# Patient Record
Sex: Male | Born: 1965 | Race: White | Hispanic: No | Smoking: Never smoker
Health system: Southern US, Community
[De-identification: ages and names within clinical notes are randomized; demographics above are authoritative.]

## PROBLEM LIST (undated history)

## (undated) DIAGNOSIS — F329 Major depressive disorder, single episode, unspecified: Secondary | ICD-10-CM

## (undated) DIAGNOSIS — K219 Gastro-esophageal reflux disease without esophagitis: Secondary | ICD-10-CM

## (undated) DIAGNOSIS — R112 Nausea with vomiting, unspecified: Secondary | ICD-10-CM

## (undated) DIAGNOSIS — F32A Depression, unspecified: Secondary | ICD-10-CM

## (undated) DIAGNOSIS — Z9889 Other specified postprocedural states: Secondary | ICD-10-CM

## (undated) DIAGNOSIS — F952 Tourette's disorder: Secondary | ICD-10-CM

## (undated) DIAGNOSIS — Z87442 Personal history of urinary calculi: Secondary | ICD-10-CM

## (undated) DIAGNOSIS — G4730 Sleep apnea, unspecified: Secondary | ICD-10-CM

## (undated) HISTORY — PX: OTHER SURGICAL HISTORY: SHX169

## (undated) HISTORY — PX: UVULOPALATOPHARYNGOPLASTY: SHX827

## (undated) HISTORY — PX: TONSILLECTOMY: SUR1361

## (undated) HISTORY — PX: FUNCTIONAL ENDOSCOPIC SINUS SURGERY: SUR616

## (undated) HISTORY — PX: BACK SURGERY: SHX140

## (undated) HISTORY — PX: SHOULDER SURGERY: SHX246

## (undated) HISTORY — PX: CHOLECYSTECTOMY: SHX55

## (undated) HISTORY — PX: ELBOW SURGERY: SHX618

---

## 1999-04-15 ENCOUNTER — Emergency Department (HOSPITAL_COMMUNITY): Admission: EM | Admit: 1999-04-15 | Discharge: 1999-04-15 | Payer: Self-pay

## 2000-09-01 ENCOUNTER — Ambulatory Visit (HOSPITAL_BASED_OUTPATIENT_CLINIC_OR_DEPARTMENT_OTHER): Admission: RE | Admit: 2000-09-01 | Discharge: 2000-09-01 | Payer: Self-pay | Admitting: Orthopedic Surgery

## 2001-12-10 ENCOUNTER — Encounter: Payer: Self-pay | Admitting: Emergency Medicine

## 2001-12-10 ENCOUNTER — Emergency Department (HOSPITAL_COMMUNITY): Admission: EM | Admit: 2001-12-10 | Discharge: 2001-12-10 | Payer: Self-pay | Admitting: Emergency Medicine

## 2002-08-30 ENCOUNTER — Ambulatory Visit (HOSPITAL_BASED_OUTPATIENT_CLINIC_OR_DEPARTMENT_OTHER): Admission: RE | Admit: 2002-08-30 | Discharge: 2002-08-30 | Payer: Self-pay | Admitting: Orthopedic Surgery

## 2003-06-11 ENCOUNTER — Emergency Department (HOSPITAL_COMMUNITY): Admission: EM | Admit: 2003-06-11 | Discharge: 2003-06-12 | Payer: Self-pay | Admitting: *Deleted

## 2004-03-04 ENCOUNTER — Ambulatory Visit: Admission: RE | Admit: 2004-03-04 | Discharge: 2004-03-04 | Payer: Self-pay | Admitting: Pulmonary Disease

## 2004-04-02 ENCOUNTER — Ambulatory Visit (HOSPITAL_BASED_OUTPATIENT_CLINIC_OR_DEPARTMENT_OTHER): Admission: RE | Admit: 2004-04-02 | Discharge: 2004-04-02 | Payer: Self-pay | Admitting: Pulmonary Disease

## 2004-05-08 ENCOUNTER — Ambulatory Visit: Payer: Self-pay | Admitting: Pulmonary Disease

## 2004-06-09 ENCOUNTER — Ambulatory Visit: Payer: Self-pay | Admitting: Pulmonary Disease

## 2004-08-28 ENCOUNTER — Ambulatory Visit: Payer: Self-pay | Admitting: Pulmonary Disease

## 2004-09-17 ENCOUNTER — Ambulatory Visit (HOSPITAL_COMMUNITY): Admission: RE | Admit: 2004-09-17 | Discharge: 2004-09-18 | Payer: Self-pay | Admitting: Orthopedic Surgery

## 2004-11-13 ENCOUNTER — Encounter (INDEPENDENT_AMBULATORY_CARE_PROVIDER_SITE_OTHER): Payer: Self-pay | Admitting: Specialist

## 2004-11-13 ENCOUNTER — Ambulatory Visit (HOSPITAL_COMMUNITY): Admission: RE | Admit: 2004-11-13 | Discharge: 2004-11-14 | Payer: Self-pay | Admitting: Otolaryngology

## 2004-11-20 ENCOUNTER — Observation Stay (HOSPITAL_COMMUNITY): Admission: AD | Admit: 2004-11-20 | Discharge: 2004-11-21 | Payer: Self-pay | Admitting: Otolaryngology

## 2005-09-20 ENCOUNTER — Encounter: Admission: RE | Admit: 2005-09-20 | Discharge: 2005-09-20 | Payer: Self-pay | Admitting: Family Medicine

## 2006-03-23 ENCOUNTER — Observation Stay (HOSPITAL_COMMUNITY): Admission: RE | Admit: 2006-03-23 | Discharge: 2006-03-25 | Payer: Self-pay | Admitting: Orthopedic Surgery

## 2006-03-23 ENCOUNTER — Encounter (INDEPENDENT_AMBULATORY_CARE_PROVIDER_SITE_OTHER): Payer: Self-pay | Admitting: *Deleted

## 2006-10-19 ENCOUNTER — Encounter: Admission: RE | Admit: 2006-10-19 | Discharge: 2006-10-19 | Payer: Self-pay | Admitting: Orthopedic Surgery

## 2007-01-07 ENCOUNTER — Encounter: Admission: RE | Admit: 2007-01-07 | Discharge: 2007-01-07 | Payer: Self-pay | Admitting: Family Medicine

## 2007-03-07 ENCOUNTER — Encounter: Admission: RE | Admit: 2007-03-07 | Discharge: 2007-03-07 | Payer: Self-pay | Admitting: Orthopedic Surgery

## 2007-09-02 IMAGING — CR DG CHEST 1V PORT
1 series · 1 of 1 positions shown · non-contrast
Comparison: 03/16/06.

CLINICAL DATA: Right-sided central line placement, now removed.  Evaluate right lung status.
PORTABLE CHEST - 1 VIEW:

[view not recorded]
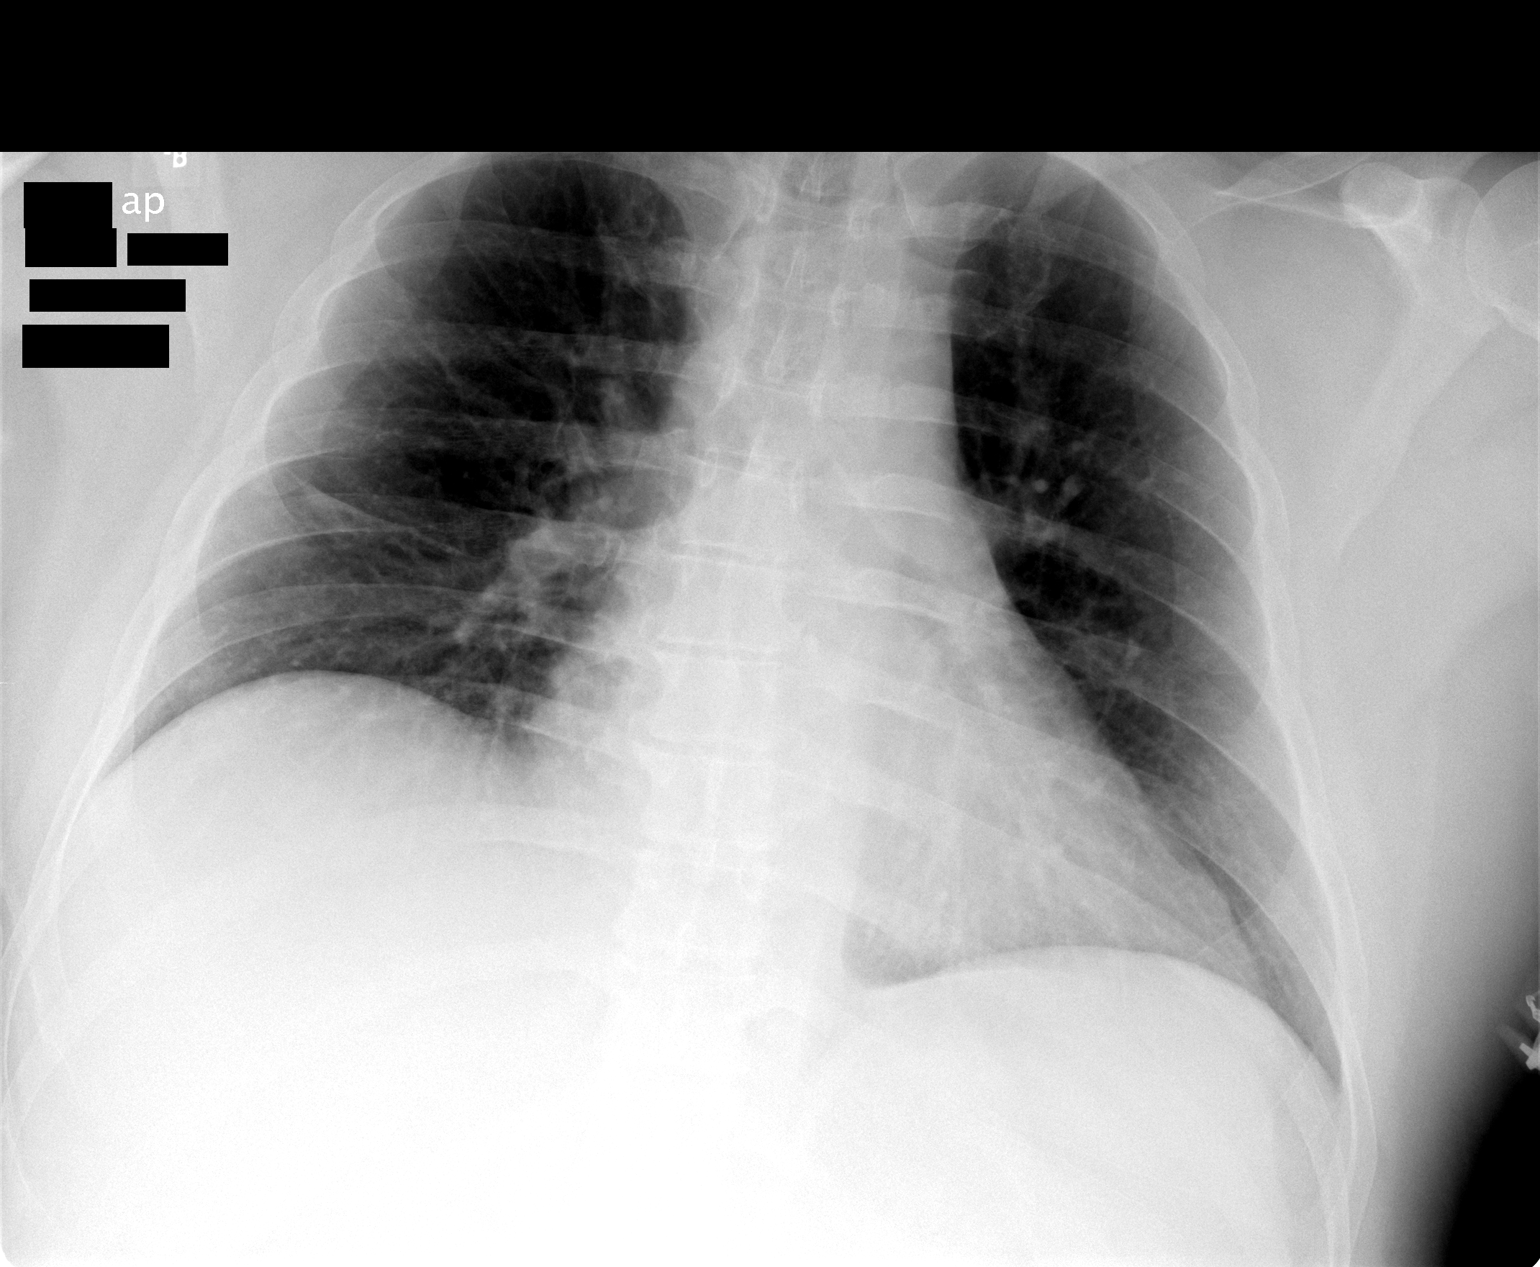

[1 of 1 positions shown; findings below may reference images not displayed]

FINDINGS: Minimal plate-like atelectasis, right lung.  Slight volume loss.  No infiltrates or failure.  No pneumothorax.  No evidence for residual central line.  Left lung clear.  Cardiac size normal.  No bony abnormality.
IMPRESSION: Minimal atelectasis, right, without evidence for pneumothorax or other acute finding.

## 2007-12-20 ENCOUNTER — Encounter: Admission: RE | Admit: 2007-12-20 | Discharge: 2007-12-20 | Payer: Self-pay | Admitting: Gastroenterology

## 2008-03-26 ENCOUNTER — Ambulatory Visit (HOSPITAL_COMMUNITY): Admission: RE | Admit: 2008-03-26 | Discharge: 2008-03-26 | Payer: Self-pay | Admitting: General Surgery

## 2010-11-14 NOTE — Op Note (Signed)
[name], George Bailey NO.:  [id number]   MEDICAL RECORD NO.:  [id number]          PATIENT TYPE:  OIB   LOCATION:  2899                         FACILITY:  [facility]   PHYSICIAN:  Harvie Junior, M.D.   DATE OF BIRTH:  [birth year]   DATE OF PROCEDURE:  09/17/2004  DATE OF DISCHARGE:                                 OPERATIVE REPORT   PREOPERATIVE DIAGNOSIS:  Lateral epicondylitis, left.   POSTOPERATIVE DIAGNOSIS:  Lateral epicondylitis, left.   PRINCIPAL PROCEDURE:  Left lateral epicondylar release.   SURGEON:  Harvie Junior, M.D.   ASSISTANT:  Marshia Ly, P.A.   ANESTHESIA:  General.   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  We ultimately treated the right side operatively and  did wonderfully with that.  He has struggled with the left side over several  years but, ultimately, began complaining of increasing pain.  We talked  about treatment options including injection therapy, exercise therapy, ice,  and mobilization.  He ultimately elected to undergo surgical intervention.  He was brought to the operating room for this procedure.   PROCEDURE:  The patient was brought to the operating room and after adequate  anesthesia was obtained with general anesthetic, patient was placed on the  operating room table.  Left arm was prepped and draped in the usual sterile  fashion.  Following this, a curved incision was made over the lateral  epicondylar area, subcutaneous tissue, down to the level of the extensor  mechanism.  Extensor mechanism was incised longitudinally.  The damaged  fibers of the accessory carpi radialis brevis were debrided.  The common  extensor origin was released from the lateral epicondylar area.  Flaps were  raised.  A fairly significant prominence of bone was identified in this area  and this was debrided.  Following this, the lateral epicondylar muscle  tissue was reattached to the  bony area.  Prior to closure, there was some  concern about synovitis in the elbow, and the elbow joint was opened and  explored, irrigated and debrided.  A minimal synovectomy was undertaken  within the elbow joint, then the closure was outlined as described above.  At this point, the subcu was closed with 0 and 2-0 Vicryl, skin with 3-0  Maxon pull-out suture.  Benzoin and Steri-Strips were applied, a sterile  compressive dressing as well as a posterior plaster.  The patient was taken  to the recovery room.  She was noted to be in satisfactory condition.  Estimated blood loss for the procedure was none.     JLG/MEDQ  D:  09/17/2004  T:  09/17/2004  Job:  [id number]

## 2010-11-14 NOTE — Op Note (Signed)
NAME:  George Bailey, George Bailey NO.:  [id number]   MEDICAL RECORD NO.:  [id number]          PATIENT TYPE:  INP   LOCATION:  2899                         FACILITY:  [facility]   PHYSICIAN:  Nelda Severe, MD      DATE OF BIRTH:  Apr 24, 1966   DATE OF PROCEDURE:  03/23/2006  DATE OF DISCHARGE:                                 OPERATIVE REPORT   PREOPERATIVE DIAGNOSIS:  L5-S1 disk degeneration with annular tear.   POSTOPERATIVE DIAGNOSIS:  L5-S1 disk degeneration with annular tear.   OPERATIVE PROCEDURE:  Anterior interbody fusion at L5-S1 with interbody cage  and anterior screw/plate fixation, bone marrow aspirate, L5 vertebral body  mixed with VITOSS (beta tricalcium phosphate) for graft material.   SURGEON:  Nelda Severe, M.D.   ASSISTANT:  Balinda Quails, M.D.   ANESTHESIA:  General endotracheal anesthesia.   OPERATIVE NOTE:  The patient was placed under general endotracheal  anesthesia.  A central line was placed by the anesthesiologist.  A Foley  catheter was placed in the bladder.  The patient had received vancomycin  intravenously preoperatively.   He is positioned supine on the [place] table.  The upper extremities were  folded across the chest, padded in all layers with foam and the secured with  tape with foam.  This was done so as to facilitate positioning of the C-arm  fluoroscopy unit.   The abdomen was clipped of hair and prepped with DuraPrep and draped in  square fashion.  The drapes were secured with Ioban.   Dr. Madilyn Fireman provided the exposure of the L5-S1 level anteriorly via transverse  incision.  His exposure record will be dictated by him.   Once the common iliac vessels had been retracted and the disk space exposed  and the median sacral vessels coagulated, we commenced disk excision.  The  annulus was incised in a rectangular fashion.  A Cobb elevator was then used  to detach the disk tissue from the endplates above and below and about 75%  of the  disk removed with a Leksell rongeur.  We then further enucleated the  disk using a combination of curettes and pituitary rongeurs.  Endplate  cartilage was curetted down to bleeding bone above and below.   Lateral C-arm view was taken to confirm level and then to confirm our  release back to the posterior border of the S1 vertebra.   Next, the appropriate Synthes cage and internal fixation device was chosen.  This was a large footprint-type device with appropriate thickness based on a  trial insertion to distract the disk space.   The L5 vertebral body was punctured with an 18-gauge needle and about 7 mL  of bone marrow withdrawn.  The bleeding was then controlled with bone wax,  once the needle was removed.  This was mixed with 5 mL of VITOSS.  The  appropriate cage was packed with VITOSS and inserted.  Fluoroscopic view on  the lateral showed satisfactory position of the cage.  The internal fixation  device was then attached to the vertebra above and below using 2 screws  above and 2  screws below, using the special jig attached to it.  We then had  AP and lateral fluoro views which showed satisfactory position of the  implant.   The wound was then closed by Dr. Madilyn Fireman.   Blood loss was estimated at approximately 100-150 mL.  There were no  intraoperative complications.  The sponge, and needle counts were correct.      Nelda Severe, MD  Electronically Signed     MT/MEDQ  D:  03/23/2006  T:  03/25/2006  Job:  [id number]   cc:   Balinda Quails, M.D.

## 2010-11-14 NOTE — Op Note (Signed)
NAME:  REMIGIO, MCMILLON NO.:  [id number]   MEDICAL RECORD NO.:  [id number]          PATIENT TYPE:  INP   LOCATION:  5014                         FACILITY:  [facility]   PHYSICIAN:  Balinda Quails, M.D.    DATE OF BIRTH:  February 07, 1966   DATE OF PROCEDURE:  03/23/2006  DATE OF DISCHARGE:                                 OPERATIVE REPORT   SURGEON:  P Bud Face, MD   ASSISTANT:  Nelda Severe, MD.   ANESTHETIC:  General endotracheal.   ANESTHESIOLOGIST:  Guadalupe Maple, M.D.   PREOPERATIVE DIAGNOSIS:  L5-S1 degenerative disk disease.   POSTOPERATIVE DIAGNOSIS:  L5-S1 degenerative disk disease.   PROCEDURE:  Retroperitoneal exposure for anterior L5-S1 interbody fixation.   CLINICAL NOTE:  George Bailey is a 45 year old male with L5-S1  degenerative disk disease and chronic pain.  He is scheduled to undergo  anterior lumbar interbody fixation L5-S1 carried out by Dr. Nelda Severe.  He was seen and evaluated preoperatively in the holding area.  Details of  the operative procedure explained to the patient before he received any  sedation.  Risks of the operative procedure including potential injury to  the vessels, ureter, and nerves, retrograde ejaculation, infection, bleeding  and death were discussed with the patient.  He consented to surgery.   OPERATIVE PROCEDURE:  The patient brought to the OR room in stable  hemodynamic condition.  Placed under general endotracheal anesthesia.  Foley  catheter, arterial line, central venous catheter inserted.  O2 saturation  probe placed on the left great toe.  This recorded 100%.  Preoperative left  dorsalis pedis pulse noted to be 2+.   A premarked transverse skin incision was made in the left lower quadrant  from midline to the lateral margin of the rectus muscles.  Dissection  carried through the subcutaneous tissue with electrocautery.  The anterior  rectus sheath unroofed across the midline and extended beyond  the incision  laterally.  The anterior rectus sheath was then opened transversely.  The  rectus muscle freed from the anterior rectus sheath superiorly and  inferiorly.  It was also freed posteriorly.  The rectus muscles then  reflected medially and the retroperitoneal space entered.  The peritoneal  contents were rotated anteriorly.  The psoas muscle identified.  The  genitofemoral nerve was preserved on the psoas muscle.  The left common  iliac artery was identified.  The sacral promontory and L5-S1 disk  identified.  The fatty structures over the anterior surface of the left  common iliac artery were freed with a Kitner.  The L5-S1 disk identified.  The left ureter and fibers of the superior hypogastric plexus were reflected  to the right.  The anterior longitudinal spinous ligament was scored with  cautery.  The middle sacral vessels were cauterized and clipped and divided.  The L5-S1 disk was exposed laterally to the right.  The left common iliac  vein was then deflected to the left.  This exposed the full anterior surface  of the L5-S1 disk.   A Thompson retractor was then positioned and with the assistance of self-  retaining retractors, the L5-S1 distally exposed.   Dr. Alveda Reasons then completed L5-S1 disk excision and anterior lumbar L5-S1  interbody fixation.   At completion of the L5-S1 procedure, the retractors were then removed.  Retroperitoneum examined to ensure there were no injuries to the ureter or  major vessels.  There was no significant bleeding.  Sponge and instrument  counts correct.   The rectus muscle was then examined to ensure there was no bleeding.  The  anterior rectus sheath closed with running 0 Vicryl suture.  Subcutaneous  tissue closed running 3-0 Vicryl suture.  Skin closed with 4-0 Vicryl  suture.  Steri-Strips applied.  Sterile dressing applied.   At termination of procedure, the patient at 2+ left dorsalis pedis pulse.  There were no apparent  complications.  The patient transferred to the  recovery room in stable condition.      Balinda Quails, M.D.  Electronically Signed     PGH/MEDQ  D:  03/23/2006  T:  03/25/2006  Job:  [id number]

## 2010-11-14 NOTE — Op Note (Signed)
[place]  Patient:    George Bailey, George Bailey                   MRN: [id number] Proc. Date: 09/01/00 Adm. Date:  [id number] Attending:  Milly Jakob                           Operative Report  PREOPERATIVE DIAGNOSIS:  Shoulder pain with suspected impingement and acromioclavicular joint spurring.  POSTOPERATIVE DIAGNOSES: 1. Shoulder pain with suspected impingement and acromioclavicular joint    spurring. 2. Labral tear anterior labrum.  PRINCIPAL PROCEDURES: 1. Anterior subacromial decompression. 2. Partial distal clavicle excision by way of coplaning. 3. Debridement of anterior labral tear.  SURGEON:  Harvie Junior, M.D.  ASSISTANT:  Currie Paris. Thedore Mins.  ANESTHESIA:  General with a scalene block.  BRIEF HISTORY:  He is a 45 year old male with a long history of having a left shoulder impingement and anterior shoulder pain.  We had injected him with excellent relief of pain, but ultimately it recurred.  We talked about treatment options, but the patient had failed conservative care and because of failure of conservative care, ultimately felt that subacromial decompression was appropriate and he was brought to the operating room for this procedure.  DESCRIPTION OF PROCEDURE:  Patient was brought to the operating room and after adequate anesthesia was obtained with general anesthetic, the patient placed in the beach chair position.  All bony prominences were well-padded.  The left arm was then prepped and draped in usual sterile fashion and following this, routine arthroscopic examination of the shoulder revealed there was an obvious anterior labral tear.  A portal was made in the front and this labral tear was debrided.  Attention was then turned to the subacromial space, where there was obvious significant anterior and lateral spurring with fairly dramatic inflammation in the subacromial space.  At this point, the  anterolateral acromioplasty was performed after take down of the CA ligament.  A coplaning of the distal clavicle was performed to the level of the acromioplasty and the dramatic bursectomy was performed in the subacromial space.  Following this, the arm easily came into full rotation and the camera was switched.  A cutting block technique was used posteriorly to ensure adequate decompression was performed, as well as coplaning of the distal clavicle.  At this point, the wounds were copiously irrigated and suctioned dry.  The arthroscopic portals were closed with Adaptic and a dressing.  The patient was taken to recovery room, where he was noted to be in satisfactory condition.  Estimated blood loss for the procedure was none. DD:  09/01/00 TD:  09/01/00 Job: [id number]

## 2010-11-14 NOTE — Procedures (Signed)
NAME:  ABB, GOBERT NO.:  [id number]   MEDICAL RECORD NO.:  [id number]          PATIENT TYPE:  OUT   LOCATION:  SLEEP CENTER                 FACILITY:  [facility]   PHYSICIAN:  Marcelyn Bruins, M.D. [facility] DATE OF BIRTH:  03-18-1966   DATE OF STUDY:  04/02/2004                              NOCTURNAL POLYSOMNOGRAM   REFERRING PHYSICIAN:  Oley Balm. Sung Amabile, M.D.   INDICATION FOR THE STUDY:  Hypersomnia with sleep apnea.   EPWORTH SLEEPINESS SCORE:  8   SLEEP ARCHITECTURE:  The patient had a total sleep time of 331 minutes but  never achieved REM or slow wave sleep.  Sleep onset latency was very  prolonged at 83 minutes.   IMPRESSION:  1.  Severe obstructive sleep apnea/hypopnea syndrome with a respiratory      disturbance index of 45 events/hr and oxygen desaturation as low as 85%.      Events were not positional.  2.  Moderate snoring noted throughout the study.  3.  No clinically significant cardiac arrhythmias.  4.  Moderate numbers of periodic leg movements with very little sleep      disruption.      KC/MEDQ  D:  04/22/2004 15:32:04  T:  04/22/2004 18:25:24  Job:  [id number]

## 2010-11-14 NOTE — Op Note (Signed)
NAME:  George Bailey, George Bailey                      ACCOUNT NO.:  [id number]   MEDICAL RECORD NO.:  [id number]                   PATIENT TYPE:  OUT   LOCATION:  CARD                                 FACILITY:  [facility]   PHYSICIAN:  Oley Balm. Sung Amabile, M.D. [facility]          DATE OF BIRTH:  05-27-1966   DATE OF PROCEDURE:  03/10/2004  DATE OF DISCHARGE:  03/04/2004                                 OPERATIVE REPORT   INDICATIONS FOR PROCEDURE:  Unexplained dyspnea.   PROCEDURE:  Cardiopulmonary stress testing was performed on a grated  treadmill using a modified Bruce protocol.  Effort was maximal.  Testing was  stopped due to dyspnea and heart rate goal.   At peak exercise, oxygen uptake was 2.83 liters per minute, or 95% predicted  maximum, indicating normal exercise tolerance.  When corrected for body  weight, his oxygen uptake was 72% of predicted maximum, consistent with mild  impairment.   At peak exercise, heart rate was 175 beats per minute, or 100% of predicted  value, indicating that cardiovascular limitation was reached.  Oxygen pulse  was normal.  EKG tracings at rest and with exercise revealed no  abnormalities; however, during recovery, there was a delayed recovery back  to his resting heart rate.  Blood pressure response was normal.   At peak exercise, ventilation was 110 liters per minute, or 84% of maximum  voluntary ventilation, indicating that ventilatory limitation was reached.  Baseline pulmonary function tests were probably normal.  There were no gas  exchange abnormalities noted.  Post-exercise spirometry revealed no exercise-  induced bronchospasm.   SUMMARY:  Normal exercise tolerance.  This corrects to mild impairment for  body weight, suggesting that his major limitation is mild obesity.  The only  other abnormality noted is a delayed heart rate recovery, which can  sometimes be indicative of ischemic heart disease.  Clinical correlation is  suggested.                                    Oley Balm Sung Amabile, M.D. [facility]    DBS/MEDQ  D:  03/10/2004  T:  03/10/2004  Job:  [id number]

## 2010-11-14 NOTE — Discharge Summary (Signed)
NAMEMarland Bailey  KYI, ROMANELLO NO.:  [id number]   MEDICAL RECORD NO.:  [id number]          PATIENT TYPE:  INP   LOCATION:  5014                         FACILITY:  [facility]   PHYSICIAN:  Nelda Severe, MD      DATE OF BIRTH:  05-14-1966   DATE OF ADMISSION:  03/23/2006  DATE OF DISCHARGE:  03/25/2006                                 DISCHARGE SUMMARY   PREOPERATIVE DIAGNOSES:  1. L5-S1 disc degeneration annular tear.  2. Acid reflux.  3. Tourette's.   DRUG ALLERGY:  ERYTHROMYCIN.   ADMITTING MEDICATIONS:  1. Hydrocodone 7.5/325 one q.4 p.r.n. for pain.  2. Orap p.r.n. for Tourette's.   PAST MEDICAL HISTORY:  Includes:  1. Sleep apnea, status post nasal and throat surgery.  No longer needs      CPAP.  2. Bilateral elbow surgery.  3. Tourette's.  4. Chronic back pain.   BRIEF HISTORY OF STAY:  Ms. George Bailey went to the OR on March 23, 2006.  Dr. Nelda Severe performed the anterior L5-S1 fusion.  Dr. Madilyn Fireman performed  visualization through the abdominal initiation, assistant Lianne Cure,  PA-C.  Postoperatively active full range of motion of toes and ankles intact  and equal bilaterally.  Sensation to light touch L4-S1 intact and equal  bilaterally.  Palpable DP and PT pulses.  Minimal blood loss.  Central line  was removed in the postoperative care unit prior to admitting her to 5000 at  [facility].  Postop day 1, passing flatulence, clear liquids tolerated well,  advance diet as tolerated.  He was placed on vancomycin prior to surgery and  continued q.12 dose by pharmacy.  Ambulating first night to and from  bathroom with standby assistance with his wife in the room.  No major  complaints.  On postoperative day 2, pain medications helps control pain.  He is walking independently now with rolling walker.   LABS:  Sodium 139, potassium 4.2, BUN 7, creatinine 1.0.   Vital signs are stable.  He is afebrile.  Neurovascularly:  Motor intact.  Bilateral lower  extremities:  Calves are soft.  He complains of very little  back pain.  Incision is clean, dry and intact.  Neck:  Bandage was removed  today.  No bleeding occurred from central line placement.  Discharged today  on March 25, 2006.   DISPOSITION:  Stable.   DIET:  As tolerate.   He is going to go home with his wife with a rolling walker, walking for  activity.  No bending, stooping or squatting.  No lifting objects.   He is going home with a prescription for hydrocodone 10, Tylenol 325 he can  take one to two q.4 to 6 p.r.n.  He is going to return to his medications as  prior to surgery for home medications and he may shower and remove the  dressing in 2 days.  He is going to follow up with Dr. Nelda Severe in the  office in one month, they will call for an appointment.  If they have  troubles or concerns, they are instructed to call us at any  time.  Also if  he needs refill, to call at anytime.      Lianne Cure, P.A.      Nelda Severe, MD  Electronically Signed    MC/MEDQ  D:  03/25/2006  T:  03/25/2006  Job:  [id number]

## 2010-11-14 NOTE — Op Note (Signed)
NAMEMarland Kitchen  George Bailey, George Bailey NO.:  [id number]   MEDICAL RECORD NO.:  [id number]          PATIENT TYPE:  OIB   LOCATION:  2550                         FACILITY:  [facility]   PHYSICIAN:  Suzanna Obey, M.D.       DATE OF BIRTH:  [birth year]   DATE OF PROCEDURE:  11/13/2004  DATE OF DISCHARGE:                                 OPERATIVE REPORT   PREOPERATIVE DIAGNOSIS:  Obstructive sleep apnea, with deviated septum and  turbinate hypertrophy.   POSTOPERATIVE DIAGNOSIS:  Obstructive sleep apnea, with deviated septum and  turbinate hypertrophy.   SURGICAL PROCEDURE:  Septoplasty, submucous resection of inferior  turbinates, uvulopharyngopalatoplasty, and tonsillectomy.   ANESTHESIA:  General endotracheal tube.   ESTIMATED BLOOD LOSS:  Approximately 10 cc.   INDICATIONS:  This is a 45 year old who has had problems with significant  obstructive sleep apnea who has failed CPAP.  He now needs to proceed with a  surgical option.  He was informed of the risks and benefits of the  procedure, including bleeding, infection, velopharyngeal insufficiency,  changes in voice, chronic pain, septal perforation, change in external  appearance of the nose, numbness of the teeth, and risk of the anesthetic.  All questions were answered, and consent was obtained.   OPERATION:  The patient was taken to the operating room and placed in the  supine position.  After adequate general endotracheal tube anesthesia, he  was prepped and draped in the usual sterile manner.  Oxymetazoline pledgets  were placed into the nose bilaterally, and then the septum and inferior  turbinates were injected with 1% lidocaine with 1:100,000 epinephrine.  A  right hemitransfixion incision was performed, raising the mucoperichondrium  and ostial flap.  The cartilage was divided about 2 cm posterior to the  caudal strut, and the posterior cartilage was removed with a Therapist, nutritional.  The opposite flap was elevated.  The  Jansen-Middleton forceps were used to  remove the deviated portion of the bone, and the 4 mm osteotome was used to  remove the large spur inferiorly.  This corrected the septal deflection.  Turbinates were infractured.  Midline incision was made with a 15 blade.  Mucosal flap elevated superiorly, and the inferior mucosa and bone were  removed with the turbinate scissors.  The edges were cauterized with suction  cautery, and both flaps were laid back down over the raw surface, and the  turbinates were outfractured.  Hemitransfixion incision closed with  interrupted 4-0 chromic, and 4-0 plain gut quilting stitch was placed  through the septum.  Telfa rolls soaked in bacitracin were placed into the  nose bilaterally and secured with a 3-0 nylon.  The operation was then  addressed to the mouth.  The Crowe-Davis mouth gag was inserted, retracted,  and suspended from the Mayo stand.  An incision was begun right along the  base of the uvula, carrying it into the left tonsillar fossa, where the  capsule of the tonsil was identified and removed with electrocautery  dissection.  Right tonsil was removed in the same fashion, with the incision  carried across the palate along the  base of the uvula into the right side.  The specimen was removed en bloc, with both tonsils and uvula all connected.  The suction cautery was used to obtain hemostasis.  The palate and tonsillar  fossae were closed with  interrupted 3-0 Vicryl.  The hypopharynx, esophagus, and stomach were  suctioned with the NG tube.  The area was irrigated.  The Crowe-Davis was  released and re-suspended.  There was hemostasis present in all locations.  The patient was then awakened and brought to recovery in stable condition.  Counts correct.      JB/MEDQ  D:  11/13/2004  T:  11/13/2004  Job:  [id number]

## 2010-11-14 NOTE — Op Note (Signed)
NAME:  George Bailey, George Bailey                      ACCOUNT NO.:  [id number]   MEDICAL RECORD NO.:  [id number]                   PATIENT TYPE:  AMB   LOCATION:  DSC                                  FACILITY:  [facility]   PHYSICIAN:  Harvie Junior, M.D.                DATE OF BIRTH:  Dec 29, 1965   DATE OF PROCEDURE:  08/30/2002  DATE OF DISCHARGE:                                 OPERATIVE REPORT   PREOPERATIVE DIAGNOSES:  Lateral epicondylitis, right, recalcitrant to  conservative care.   POSTOPERATIVE DIAGNOSES:  Lateral epicondylitis, right, recalcitrant to  conservative care.   PRINCIPAL PROCEDURE:  Right Nirschl procedure.   SURGEON:  Harvie Junior, M.D.   ASSISTANT:  Marshia Ly, P.A.   ANESTHESIA:  General.   BRIEF HISTORY:  He is a 45 year old male with a long history of having right  lateral epicondylar pain. The patient has been evaluated and done exercise  protocol, he has done medication, he has done physical therapy, done  injection therapy and none of this seemed to work. Because of continued  complaints of pain, the patient was ultimately taken to the operating room  for a lateral Nirschl procedure.   DESCRIPTION OF PROCEDURE:  The patient was taken to the operating room and  after adequate anesthesia was obtained with general anesthetic, the patient  was placed supine on the operating table. The right arm was prepped and  draped in the usual sterile fashion. Following Esmarch exsanguination both  extremities were inflated to 350 mmHg. Following this, a midline incision  was made over the lateral aspect of the elbow. The subcutaneous tissue was  dissected down to the level of the lateral muscular group. Flaps were then  raised off the lateral epicondyle and a large spur was encountered over the  lateral epicondyle. This was debrided with the rongeur back to a bleeding  bed of bone. The extensor carpi radialis brevis tendon insertion was then  identified below the  lateral mass insertion. There was a significant amount  of gelatinous material which was identified. Following this, attention was  turned towards the lateral elbow where a small rent was made in the brevis  insertion and the joint was inspected, no evidence of pathology within the  joint. At this point, the wound was copiously irrigated and suctioned dry.  The joint was copiously irrigated and suctioned dry. The lateral epicondylar  muscle structure was then closed with #0 Vicryl running suture. The subcu  was then closed with #0 and 2-0 Vicryl and the skin with a 3-0 Maxon pullout  suture. A sterile compressive dressing was applied as well as Benzoin and  Steri-Strips to the wound and the long arm plaster splint was then applied.  The patient was taken to the recovery room where she was noted to be in  satisfactory condition. Estimated blood loss for the procedure was none.  Harvie Junior, M.D.    Ranae Plumber  D:  08/30/2002  T:  08/30/2002  Job:  [id number]

## 2011-03-30 LAB — COMPREHENSIVE METABOLIC PANEL
ALT: 71 — ABNORMAL HIGH
AST: 50 — ABNORMAL HIGH
Albumin: 4.3
Alkaline Phosphatase: 53
BUN: 13
CO2: 26
Calcium: 9.9
Chloride: 103
Creatinine, Ser: 1.04
GFR calc Af Amer: >60
GFR calc non Af Amer: >60
Glucose, Bld: 101 — ABNORMAL HIGH
Potassium: 4.3
Sodium: 138
Total Bilirubin: 0.7
Total Protein: 7.1

## 2011-03-30 LAB — CBC
HCT: 44
Hemoglobin: 15.1
MCHC: 34.4
MCV: 85.5
Platelets: 243
RBC: 5.14
RDW: 13.9
WBC: 6.5

## 2011-03-30 LAB — DIFFERENTIAL
Basophils Absolute: 0
Basophils Relative: 1
Eosinophils Absolute: 0.1
Eosinophils Relative: 1
Lymphocytes Relative: 25
Lymphs Abs: 1.7
Monocytes Absolute: 0.4
Monocytes Relative: 7
Neutro Abs: 4.3
Neutrophils Relative %: 66

## 2011-03-30 LAB — PROTIME-INR
INR: 1
Prothrombin Time: 13

## 2012-06-29 HISTORY — PX: COLON SURGERY: SHX602

## 2015-10-30 ENCOUNTER — Other Ambulatory Visit: Payer: Self-pay | Admitting: Orthopedic Surgery

## 2015-10-30 DIAGNOSIS — M654 Radial styloid tenosynovitis [de Quervain]: Secondary | ICD-10-CM | POA: Diagnosis not present

## 2015-11-05 ENCOUNTER — Encounter (HOSPITAL_BASED_OUTPATIENT_CLINIC_OR_DEPARTMENT_OTHER): Payer: Self-pay | Admitting: *Deleted

## 2015-11-07 NOTE — H&P (Signed)
George Bailey is an 50 y.o. male.   CC / Reason for Visit: Left wrist pain HPI: This patient returns for reevaluation, indicating that his left wrist de Quervain's tenosynovitis remains fairly symptomatic despite 2 previous injections, one on 05-15-15 and the second on 08-05-15.  He reports that he has complied with instructions regarding stretching, splinting, etc.  He wishes to proceed surgically.  HPI 08-05-15: This patient returns for reevaluation, indicating that his left-sided de Quervain's symptoms have recurred after an initial period resolution.  He takes a few aspirin in the morning, and perhaps an Aleve or 2 later in the day.  He has a forearm-based thumb spica splint with him but is unable to demonstrate the appropriate stretching exercises.  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  He reports that he has been experiencing radial sided left wrist pain for approximately a month.  He indicates that he did not have any mechanism of injury, but the onset was gradual.  The patient has been utilizing a cockup wrist splint which has been helping just remind him not to grab things in appropriately and also Aleve as well as aspirin which have been minimally helpful if at all.  Past Medical History  Diagnosis Date  . Depression   . GERD (gastroesophageal reflux disease)   . PONV (postoperative nausea and vomiting)   . Tourette's disorder   . Sleep apnea     cannot wear CPAP due to tourett's syndrome    Past Surgical History  Procedure Laterality Date  . Tonsillectomy    . Colon surgery  2014    colon resection due to diverticulitis  . Cholecystectomy    . Back surgery      L5-S1 fusion  . Uvulopalatopharyngoplasty    . Functional endoscopic sinus surgery    . Shoulder surgery Left   . Elbow surgery Bilateral     History reviewed. No pertinent family history. Social History:  reports that he has never smoked. He does not  have any smokeless tobacco history on file. He reports that he drinks alcohol. He reports that he does not use illicit drugs.  Allergies:  Allergies  Allergen Reactions  . Erythromycin Nausea Only    No prescriptions prior to admission    No results found for this or any previous visit (from the past 48 hour(s)). No results found.  Review of Systems  All other systems reviewed and are negative.   Height  (1.753 m), weight 95.255 kg (210 lb). Physical Exam  Constitutional:  WD, WN, NAD HEENT:  NCAT, EOMI Neuro/Psych:  Alert & oriented to person, place, and time; appropriate mood & affect Lymphatic: No generalized UE edema or lymphadenopathy Extremities / MSK:  Both UE are normal with respect to appearance, ranges of motion, joint stability, muscle strength/tone, sensation, & perfusion except as otherwise noted:  There is some minimal swelling over the radial styloid on the left.  Tender 1st dorsal compartment tendons, with positive Lourena Simmonds and EPB stress.    Labs / X-rays:  No radiographic studies obtained today.  Assessment: Left de Quervain's tenosynovitis--recurrent following injections on 05-15-15 & 08-05-15  Plan:  He would like to proceed fairly promptly with surgical treatment.  We will schedule accordingly.  We discussed de Quervain's release, and the postoperative regimen such as stretching and skin mobility exercises to begin fairly promptly.The details of the operative procedure were discussed with the patient.  Questions were invited and answered.  In addition to the goal of the procedure, the risks of the procedure to include but not limited to bleeding; infection; damage to the nerves or blood vessels that could result in bleeding, numbness, weakness, chronic pain, and the need for additional procedures; stiffness; the need for revision surgery; and anesthetic risks were reviewed.  No specific outcome was guaranteed or implied.  Informed consent was  obtained.  Pearson Picou A., MD 11/07/2015, 1:43 PM

## 2015-11-12 ENCOUNTER — Encounter (HOSPITAL_BASED_OUTPATIENT_CLINIC_OR_DEPARTMENT_OTHER): Payer: Self-pay

## 2015-11-12 ENCOUNTER — Ambulatory Visit (HOSPITAL_BASED_OUTPATIENT_CLINIC_OR_DEPARTMENT_OTHER)
Admission: RE | Admit: 2015-11-12 | Discharge: 2015-11-12 | Disposition: A | Discharge status: Home / Self Care | Payer: BLUE CROSS/BLUE SHIELD | Source: Ambulatory Visit | Attending: Orthopedic Surgery | Admitting: Orthopedic Surgery

## 2015-11-12 ENCOUNTER — Encounter (HOSPITAL_BASED_OUTPATIENT_CLINIC_OR_DEPARTMENT_OTHER)
Admission: RE | Disposition: A | Discharge status: Home / Self Care | Payer: Self-pay | Source: Ambulatory Visit | Attending: Orthopedic Surgery

## 2015-11-12 ENCOUNTER — Ambulatory Visit (HOSPITAL_BASED_OUTPATIENT_CLINIC_OR_DEPARTMENT_OTHER): Payer: BLUE CROSS/BLUE SHIELD | Admitting: Anesthesiology

## 2015-11-12 DIAGNOSIS — E669 Obesity, unspecified: Secondary | ICD-10-CM | POA: Diagnosis not present

## 2015-11-12 DIAGNOSIS — Z6831 Body mass index (BMI) 31.0-31.9, adult: Secondary | ICD-10-CM | POA: Diagnosis not present

## 2015-11-12 DIAGNOSIS — M654 Radial styloid tenosynovitis [de Quervain]: Secondary | ICD-10-CM | POA: Insufficient documentation

## 2015-11-12 DIAGNOSIS — F952 Tourette's disorder: Secondary | ICD-10-CM | POA: Insufficient documentation

## 2015-11-12 DIAGNOSIS — G4730 Sleep apnea, unspecified: Secondary | ICD-10-CM | POA: Insufficient documentation

## 2015-11-12 HISTORY — DX: Gastro-esophageal reflux disease without esophagitis: K21.9

## 2015-11-12 HISTORY — PX: DORSAL COMPARTMENT RELEASE: SHX5039

## 2015-11-12 HISTORY — DX: Other specified postprocedural states: Z98.890

## 2015-11-12 HISTORY — DX: Sleep apnea, unspecified: G47.30

## 2015-11-12 HISTORY — DX: Nausea with vomiting, unspecified: R11.2

## 2015-11-12 HISTORY — DX: Major depressive disorder, single episode, unspecified: F32.9

## 2015-11-12 HISTORY — DX: Depression, unspecified: F32.A

## 2015-11-12 HISTORY — DX: Tourette's disorder: F95.2

## 2015-11-12 SURGERY — RELEASE, FIRST DORSAL COMPARTMENT, HAND
Anesthesia: Monitor Anesthesia Care | Site: Wrist | Laterality: Left

## 2015-11-12 MED ORDER — ONDANSETRON HCL 4 MG/2ML IJ SOLN
INTRAMUSCULAR | Status: DC | PRN
  Administered 2015-11-12: 4 mg via INTRAVENOUS

## 2015-11-12 MED ORDER — GLYCOPYRROLATE 0.2 MG/ML IJ SOLN: 0.2000 mg | Freq: Once | INTRAMUSCULAR | Status: DC | PRN

## 2015-11-12 MED ORDER — ACETAMINOPHEN 160 MG/5ML PO SOLN: 325.0000 mg | ORAL | Status: DC | PRN

## 2015-11-12 MED ORDER — PROPOFOL 10 MG/ML IV BOLUS
INTRAVENOUS | Status: AC
  Filled 2015-11-12: qty 20

## 2015-11-12 MED ORDER — FENTANYL CITRATE (PF) 100 MCG/2ML IJ SOLN: 25.0000 ug | INTRAMUSCULAR | Status: DC | PRN

## 2015-11-12 MED ORDER — HYDROCODONE-ACETAMINOPHEN 5-325 MG PO TABS: 1.0000 | ORAL_TABLET | Freq: Four times a day (QID) | ORAL | Status: DC | PRN

## 2015-11-12 MED ORDER — OXYCODONE HCL 5 MG/5ML PO SOLN: 5.0000 mg | Freq: Once | ORAL | Status: DC | PRN

## 2015-11-12 MED ORDER — LIDOCAINE HCL 1 % IJ SOLN
INTRAMUSCULAR | Status: DC | PRN
  Administered 2015-11-12: 12 mL via INTRAMUSCULAR

## 2015-11-12 MED ORDER — OXYCODONE HCL 5 MG PO TABS: 5.0000 mg | ORAL_TABLET | Freq: Once | ORAL | Status: DC | PRN

## 2015-11-12 MED ORDER — PROPOFOL 10 MG/ML IV BOLUS
INTRAVENOUS | Status: DC | PRN
  Administered 2015-11-12: 20 mg via INTRAVENOUS
  Administered 2015-11-12 (×3): 10 mg via INTRAVENOUS

## 2015-11-12 MED ORDER — LACTATED RINGERS IV SOLN
INTRAVENOUS | Status: DC
  Administered 2015-11-12: 11:00:00 via INTRAVENOUS

## 2015-11-12 MED ORDER — MIDAZOLAM HCL 2 MG/2ML IJ SOLN
1.0000 mg | INTRAMUSCULAR | Status: DC | PRN
  Administered 2015-11-12: 2 mg via INTRAVENOUS

## 2015-11-12 MED ORDER — BUPIVACAINE-EPINEPHRINE (PF) 0.5% -1:200000 IJ SOLN
INTRAMUSCULAR | Status: AC
  Filled 2015-11-12: qty 30

## 2015-11-12 MED ORDER — SCOPOLAMINE 1 MG/3DAYS TD PT72: 1.0000 | MEDICATED_PATCH | Freq: Once | TRANSDERMAL | Status: DC | PRN

## 2015-11-12 MED ORDER — ACETAMINOPHEN 325 MG PO TABS: 325.0000 mg | ORAL_TABLET | ORAL | Status: DC | PRN

## 2015-11-12 MED ORDER — PROPOFOL 500 MG/50ML IV EMUL: INTRAVENOUS | Status: DC | PRN

## 2015-11-12 MED ORDER — LACTATED RINGERS IV SOLN
INTRAVENOUS | Status: DC
  Administered 2015-11-12: 13:00:00 via INTRAVENOUS

## 2015-11-12 MED ORDER — LIDOCAINE HCL (PF) 1 % IJ SOLN
INTRAMUSCULAR | Status: AC
  Filled 2015-11-12: qty 30

## 2015-11-12 MED ORDER — MIDAZOLAM HCL 2 MG/2ML IJ SOLN
INTRAMUSCULAR | Status: AC
  Filled 2015-11-12: qty 2

## 2015-11-12 MED ORDER — CEFAZOLIN SODIUM-DEXTROSE 2-4 GM/100ML-% IV SOLN
2.0000 g | INTRAVENOUS | Status: AC
  Administered 2015-11-12: 2 g via INTRAVENOUS

## 2015-11-12 MED ORDER — FENTANYL CITRATE (PF) 100 MCG/2ML IJ SOLN
INTRAMUSCULAR | Status: AC
  Filled 2015-11-12: qty 2

## 2015-11-12 MED ORDER — FENTANYL CITRATE (PF) 100 MCG/2ML IJ SOLN
50.0000 ug | INTRAMUSCULAR | Status: DC | PRN
  Administered 2015-11-12 (×2): 50 ug via INTRAVENOUS

## 2015-11-12 MED ORDER — LIDOCAINE HCL (CARDIAC) 20 MG/ML IV SOLN
INTRAVENOUS | Status: DC | PRN
  Administered 2015-11-12: 30 mg via INTRAVENOUS

## 2015-11-12 SURGICAL SUPPLY — 37 items
BLADE SURG 15 STRL LF DISP TIS (BLADE) ×1 IMPLANT
BLADE SURG 15 STRL SS (BLADE) ×1
BNDG COHESIVE 4X5 TAN STRL (GAUZE/BANDAGES/DRESSINGS) ×2 IMPLANT
BNDG ESMARK 4X9 LF (GAUZE/BANDAGES/DRESSINGS) ×2 IMPLANT
BNDG GAUZE ELAST 4 BULKY (GAUZE/BANDAGES/DRESSINGS) ×4 IMPLANT
CHLORAPREP W/TINT 26ML (MISCELLANEOUS) ×2 IMPLANT
COVER BACK TABLE 60X90IN (DRAPES) ×2 IMPLANT
COVER MAYO STAND STRL (DRAPES) ×2 IMPLANT
CUFF TOURNIQUET SINGLE 18IN (TOURNIQUET CUFF) ×2 IMPLANT
DRAPE EXTREMITY T 121X128X90 (DRAPE) ×2 IMPLANT
DRAPE SURG 17X23 STRL (DRAPES) ×2 IMPLANT
DRSG EMULSION OIL 3X3 NADH (GAUZE/BANDAGES/DRESSINGS) ×2 IMPLANT
GLOVE BIO SURGEON STRL SZ7.5 (GLOVE) ×2 IMPLANT
GLOVE BIOGEL PI IND STRL 7.0 (GLOVE) ×1 IMPLANT
GLOVE BIOGEL PI IND STRL 8 (GLOVE) ×2 IMPLANT
GLOVE BIOGEL PI INDICATOR 7.0 (GLOVE) ×1
GLOVE BIOGEL PI INDICATOR 8 (GLOVE) ×2
GLOVE ECLIPSE 6.5 STRL STRAW (GLOVE) ×2 IMPLANT
GLOVE SURG SS PI 7.5 STRL IVOR (GLOVE) ×2 IMPLANT
GOWN STRL REUS W/ TWL LRG LVL3 (GOWN DISPOSABLE) ×1 IMPLANT
GOWN STRL REUS W/TWL LRG LVL3 (GOWN DISPOSABLE) ×1
GOWN STRL REUS W/TWL XL LVL3 (GOWN DISPOSABLE) ×4 IMPLANT
NDL SAFETY ECLIPSE 18X1.5 (NEEDLE) ×1 IMPLANT
NEEDLE HYPO 18GX1.5 SHARP (NEEDLE) ×1
NEEDLE HYPO 25X1 1.5 SAFETY (NEEDLE) ×4 IMPLANT
PACK BASIN DAY SURGERY FS (CUSTOM PROCEDURE TRAY) ×2 IMPLANT
PADDING CAST ABS 4INX4YD NS (CAST SUPPLIES) ×1
PADDING CAST ABS COTTON 4X4 ST (CAST SUPPLIES) ×1 IMPLANT
SPLINT PLASTER CAST XFAST 3X15 (CAST SUPPLIES) IMPLANT
SPLINT PLASTER XTRA FASTSET 3X (CAST SUPPLIES)
SPONGE GAUZE 4X4 12PLY STER LF (GAUZE/BANDAGES/DRESSINGS) ×2 IMPLANT
STOCKINETTE 6  STRL (DRAPES) ×1
STOCKINETTE 6 STRL (DRAPES) ×1 IMPLANT
SUT VICRYL RAPIDE 4/0 PS 2 (SUTURE) ×2 IMPLANT
SYRINGE 10CC LL (SYRINGE) ×4 IMPLANT
TOWEL OR 17X24 6PK STRL BLUE (TOWEL DISPOSABLE) ×2 IMPLANT
UNDERPAD 30X30 (UNDERPADS AND DIAPERS) ×2 IMPLANT

## 2015-11-12 NOTE — Op Note (Signed)
11/12/2015  12:24 PM  PATIENT:  George Bailey  50 y.o. male  PRE-OPERATIVE DIAGNOSIS:  Left wrist deQuervain's tenosynovitis  POST-OPERATIVE DIAGNOSIS:  Same  PROCEDURE:  Left wrist deQuervain's release (1st dorsal compartment plasty)  SURGEON: Cliffton Astersavid A. Janee Mornhompson, MD  PHYSICIAN ASSISTANT: Danielle RankinKirsten Schrader, OPA-C  ANESTHESIA:  local and MAC  SPECIMENS:  None  DRAINS:   None  EBL:  less than 50 mL  PREOPERATIVE INDICATIONS:  George Bailey is a  50 y.o. male with persistent left wrist deQuervain's disease, failed non-op management  The risks benefits and alternatives were discussed with the patient preoperatively including but not limited to the risks of infection, bleeding, nerve injury, cardiopulmonary complications, the need for revision surgery, among others, and the patient verbalized understanding and consented to proceed.  OPERATIVE IMPLANTS: None  OPERATIVE PROCEDURE:  After receiving prophylactic antibiotics, the patient was escorted to the operative theatre and placed in a supine position.  A surgical "time-out" was performed during which the planned procedure, proposed operative site, and the correct patient identity were compared to the operative consent and agreement confirmed by the circulating nurse according to current facility policy.  The incision was marked and a field block performed by me with a mixture of lidocaine and Marcaine bearing epinephrine.  Following application of a tourniquet to the operative extremity, the exposed skin was prepped with Chloraprep and draped in the usual sterile fashion.  The limb was exsanguinated with an Esmarch bandage and the tourniquet inflated to approximately 100mmHg higher than systolic BP.  An oblique incision was made over the first dorsal compartment tendons.  Full-thickness flaps were elevated, protecting the superficial radial nerve the retinaculum over the first dorsal compartment was split in a Z-plasty fashion for  later reapproximation and expanded position, preventing tightness about the first dorsal compartment tendons.  The EPB tendon was found supple compartmental eyes to.  The intracompartmental septum was excised.  The EPB the tendon was also frayed and partially transected, so this was debrided as well as thickened tenosynovium around both APL and EPB.  The wound was irrigated.  The Z-plasty was then reapproximated in the expanded position and secured with a couple stitches of 4-0 Vicryl Rapide interrupted suture.  This closure was quite loose about the tendons.  Tourniquet was released, additional hemostasis obtained, and the skin was closed with 4-0 Vicryl Rapide running horizontal mattress suture.  A short arm splint dressing was applied and he was taken to recovery in stable condition.    DISPOSITION: He'll be discharged home today, with typical instructions, including the initiation skin mobility and tendon stretching exercises after 72 hours, returning to his removable wrist splint in between exercise sets.

## 2015-11-12 NOTE — Anesthesia Postprocedure Evaluation (Signed)
Anesthesia Post Note  Patient: Dayna RamusKeith A Merle  Procedure(s) Performed: Procedure(s) (LRB): LEFT WRIST DEQUERVAIN'S RELEASE (Left)  Patient location during evaluation: PACU Anesthesia Type: MAC Level of consciousness: awake Pain management: pain level controlled Vital Signs Assessment: post-procedure vital signs reviewed and stable Respiratory status: spontaneous breathing Cardiovascular status: stable Postop Assessment: no signs of nausea or vomiting Anesthetic complications: no    Last Vitals:  Filed Vitals:   11/12/15 1358 11/12/15 1430  BP:  131/90  Pulse: 67 64  Temp:  36.5 C  Resp: 10 16    Last Pain: There were no vitals filed for this visit.               Dorien Mayotte

## 2015-11-12 NOTE — Anesthesia Procedure Notes (Signed)
Procedure Name: MAC Date/Time: 11/12/2015 1:09 PM Performed by: Lavelle Berland D Pre-anesthesia Checklist: Patient identified, Emergency Drugs available, Suction available, Patient being monitored and Timeout performed Patient Re-evaluated:Patient Re-evaluated prior to inductionOxygen Delivery Method: Simple face mask

## 2015-11-12 NOTE — Anesthesia Preprocedure Evaluation (Signed)
Anesthesia Evaluation  Patient identified by MRN, date of birth, ID band Patient awake    Reviewed: Allergy & Precautions, NPO status , Patient's Chart, lab work & pertinent test results  Airway Mallampati: II  TM Distance: >3 FB Neck ROM: Full    Dental  (+) Teeth Intact, Dental Advisory Given   Pulmonary    breath sounds clear to auscultation       Cardiovascular  Rhythm:Regular Rate:Normal     Neuro/Psych    GI/Hepatic   Endo/Other    Renal/GU      Musculoskeletal   Abdominal (+) + obese,   Peds  Hematology   Anesthesia Other Findings   Reproductive/Obstetrics                             Anesthesia Physical Anesthesia Plan  ASA: II  Anesthesia Plan: MAC   Post-op Pain Management:    Induction: Intravenous  Airway Management Planned: Natural Airway and Simple Face Mask  Additional Equipment:   Intra-op Plan:   Post-operative Plan:   Informed Consent: I have reviewed the patients History and Physical, chart, labs and discussed the procedure including the risks, benefits and alternatives for the proposed anesthesia with the patient or authorized representative who has indicated his/her understanding and acceptance.   Dental advisory given  Plan Discussed with: CRNA and Anesthesiologist  Anesthesia Plan Comments:         Anesthesia Quick Evaluation

## 2015-11-12 NOTE — Transfer of Care (Signed)
Immediate Anesthesia Transfer of Care Note  Patient: George RamusKeith A Casady  Procedure(s) Performed: Procedure(s): LEFT WRIST DEQUERVAIN'S RELEASE (Left)  Patient Location: PACU  Anesthesia Type:MAC  Level of Consciousness: awake, alert , oriented and patient cooperative  Airway & Oxygen Therapy: Patient Spontanous Breathing and Patient connected to face mask oxygen  Post-op Assessment: Report given to RN and Post -op Vital signs reviewed and stable  Post vital signs: Reviewed and stable  Last Vitals:  Filed Vitals:   11/12/15 1040  BP: 164/101  Pulse: 89  Temp: 36.7 C  Resp: 18    Last Pain: There were no vitals filed for this visit.       Complications: No apparent anesthesia complications

## 2015-11-12 NOTE — Interval H&P Note (Signed)
History and Physical Interval Note:  11/12/2015 12:23 PM  George Bailey  has presented today for surgery, with the diagnosis of LEFT WRIST DEQUERVAIN'S TENOSYNOVITIS M65.4  The various methods of treatment have been discussed with the patient and family. After consideration of risks, benefits and other options for treatment, the patient has consented to  Procedure(s): LEFT WRIST DEQUERVAIN'S RELEASE (Left) as a surgical intervention .  The patient's history has been reviewed, patient examined, no change in status, stable for surgery.  I have reviewed the patient's chart and labs.  Questions were answered to the patient's satisfaction.     Shevon Sian A.

## 2015-11-12 NOTE — Discharge Instructions (Signed)
Discharge Instructions   You have a light dressing on your hand.  You may begin gentle motion of your fingers and hand immediately, but you should not do any heavy lifting or gripping.  Elevate your hand to reduce pain & swelling of the digits.  Ice over the operative site may be helpful to reduce pain & swelling.  DO NOT USE HEAT. Pain medicine has been prescribed for you.  Use your medicine as needed over the first 48 hours, and then you can begin to taper your use. You may use Tylenol in place of your prescribed pain medication, but not IN ADDITION to it. Leave the dressing in place until the third day after your surgery and then remove it. Replace it with your removable thumb spica wrist splint and begin stretching and skin mobility exercises. After the bandage has been removed you may shower, regularly washing the incision and letting the water run over it, but not submerging it (no swimming, soaking it in dishwater, etc.) You may drive a car when you are off of prescription pain medications and can safely control your vehicle with both hands. We will address whether therapy will be required or not when you return to the office. You may have already made your follow-up appointment when we completed your preop visit.  If not, please call our office today or the next business day to make your return appointment for 10-15 days after surgery.   Please call [phone] during normal business hours or [phone] after hours for any problems. Including the following:  - excessive redness of the incisions - drainage for more than 4 days - fever of more than 101.5 F  *Please note that pain medications will not be refilled after hours or on weekends.   Post Anesthesia Home Care Instructions  Activity: Get plenty of rest for the remainder of the day. A responsible adult should stay with you for 24 hours following the procedure.  For the next 24 hours, DO NOT: -Drive a car -Social workerperate  machinery -Drink alcoholic beverages -Take any medication unless instructed by your physician -Make any legal decisions or sign important papers.  Meals: Start with liquid foods such as gelatin or soup. Progress to regular foods as tolerated. Avoid greasy, spicy, heavy foods. If nausea and/or vomiting occur, drink only clear liquids until the nausea and/or vomiting subsides. Call your physician if vomiting continues.  Special Instructions/Symptoms: Your throat may feel dry or sore from the anesthesia or the breathing tube placed in your throat during surgery. If this causes discomfort, gargle with warm salt water. The discomfort should disappear within 24 hours.  If you had a scopolamine patch placed behind your ear for the management of post- operative nausea and/or vomiting:  1. The medication in the patch is effective for 72 hours, after which it should be removed.  Wrap patch in a tissue and discard in the trash. Wash hands thoroughly with soap and water. 2. You may remove the patch earlier than 72 hours if you experience unpleasant side effects which may include dry mouth, dizziness or visual disturbances. 3. Avoid touching the patch. Wash your hands with soap and water after contact with the patch.

## 2015-11-13 ENCOUNTER — Encounter (HOSPITAL_BASED_OUTPATIENT_CLINIC_OR_DEPARTMENT_OTHER): Payer: Self-pay | Admitting: Orthopedic Surgery

## 2015-12-19 DIAGNOSIS — F419 Anxiety disorder, unspecified: Secondary | ICD-10-CM | POA: Diagnosis not present

## 2015-12-19 DIAGNOSIS — F5105 Insomnia due to other mental disorder: Secondary | ICD-10-CM | POA: Diagnosis not present

## 2015-12-19 DIAGNOSIS — F331 Major depressive disorder, recurrent, moderate: Secondary | ICD-10-CM | POA: Diagnosis not present

## 2015-12-19 DIAGNOSIS — F99 Mental disorder, not otherwise specified: Secondary | ICD-10-CM | POA: Diagnosis not present

## 2016-01-15 DIAGNOSIS — H5213 Myopia, bilateral: Secondary | ICD-10-CM | POA: Diagnosis not present

## 2016-01-15 DIAGNOSIS — H5210 Myopia, unspecified eye: Secondary | ICD-10-CM | POA: Diagnosis not present

## 2016-03-19 DIAGNOSIS — M7052 Other bursitis of knee, left knee: Secondary | ICD-10-CM | POA: Diagnosis not present

## 2016-07-27 ENCOUNTER — Ambulatory Visit
Admission: RE | Admit: 2016-07-27 | Discharge: 2016-07-27 | Disposition: A | Discharge status: Home / Self Care | Payer: BLUE CROSS/BLUE SHIELD | Source: Ambulatory Visit | Attending: Physician Assistant | Admitting: Physician Assistant

## 2016-07-27 ENCOUNTER — Other Ambulatory Visit: Payer: Self-pay | Admitting: Physician Assistant

## 2016-07-27 DIAGNOSIS — R0602 Shortness of breath: Secondary | ICD-10-CM

## 2016-07-27 DIAGNOSIS — R062 Wheezing: Secondary | ICD-10-CM

## 2016-07-27 DIAGNOSIS — R079 Chest pain, unspecified: Secondary | ICD-10-CM | POA: Diagnosis not present

## 2016-07-27 DIAGNOSIS — R05 Cough: Secondary | ICD-10-CM | POA: Diagnosis not present

## 2016-07-27 DIAGNOSIS — R5383 Other fatigue: Secondary | ICD-10-CM | POA: Diagnosis not present

## 2016-08-04 ENCOUNTER — Encounter (INDEPENDENT_AMBULATORY_CARE_PROVIDER_SITE_OTHER): Payer: BLUE CROSS/BLUE SHIELD

## 2016-08-04 ENCOUNTER — Other Ambulatory Visit: Payer: Self-pay | Admitting: Physician Assistant

## 2016-08-04 DIAGNOSIS — R079 Chest pain, unspecified: Secondary | ICD-10-CM

## 2016-08-04 DIAGNOSIS — R072 Precordial pain: Secondary | ICD-10-CM

## 2016-08-04 HISTORY — PX: OTHER SURGICAL HISTORY: SHX169

## 2016-08-04 LAB — EXERCISE TOLERANCE TEST
Estimated workload: 7 METS
Exercise duration (min): 5 min
Exercise duration (sec): 0 s
MPHR: 170 {beats}/min
Peak HR: 157 {beats}/min
Percent HR: 92 %
RPE: 17
Rest HR: 95 {beats}/min

## 2016-08-26 DIAGNOSIS — R351 Nocturia: Secondary | ICD-10-CM | POA: Diagnosis not present

## 2016-08-26 DIAGNOSIS — R079 Chest pain, unspecified: Secondary | ICD-10-CM | POA: Diagnosis not present

## 2016-08-26 DIAGNOSIS — K219 Gastro-esophageal reflux disease without esophagitis: Secondary | ICD-10-CM | POA: Diagnosis not present

## 2016-09-03 ENCOUNTER — Telehealth: Payer: Self-pay | Admitting: Cardiology

## 2016-09-03 NOTE — Telephone Encounter (Signed)
Received records from [facility] for appointment on 09/07/16 with Dr Herbie BaltimoreHarding.  Records put with Dr Elissa HeftyHarding's schedule for 09/07/16. lp

## 2016-09-07 ENCOUNTER — Ambulatory Visit (INDEPENDENT_AMBULATORY_CARE_PROVIDER_SITE_OTHER): Payer: BLUE CROSS/BLUE SHIELD | Admitting: Cardiology

## 2016-09-07 ENCOUNTER — Encounter: Payer: Self-pay | Admitting: Cardiology

## 2016-09-07 VITALS — BP 132/92 | HR 96 | Ht 69.5 in | Wt 226.0 lb

## 2016-09-07 DIAGNOSIS — R002 Palpitations: Secondary | ICD-10-CM | POA: Diagnosis not present

## 2016-09-07 DIAGNOSIS — R079 Chest pain, unspecified: Secondary | ICD-10-CM | POA: Diagnosis not present

## 2016-09-07 NOTE — Patient Instructions (Addendum)
Schedule at [address] Your physician has requested that you have a lexiscan myoview. For further information please visit [email or web address]. Please follow instruction sheet, as given.   No other changes at present --   Your physician recommends that you schedule a follow-up appointment in 4 weeks with Dr Herbie BaltimoreHarding.   If you need a refill on your cardiac medications before your next appointment, please call your pharmacy.

## 2016-09-07 NOTE — Assessment & Plan Note (Signed)
This point, he is more concerned of the chest pain than the palpitations. These are not overly worrisome to him. If they get worse, we may need to reassess with a 30 day monitor, but for now will just simply monitor.

## 2016-09-07 NOTE — Assessment & Plan Note (Addendum)
Persistent episodes of worrisome chest discomfort - some components are typical & others atypical. Fam Hx - mother with premature CAD-MI.  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 Significant anxiety. Normal GXT with persistent Sx - Plan Lexiscan Myoview.

## 2016-09-07 NOTE — Progress Notes (Signed)
PCP: REDMON,NOELLE, PA-C  Clinic Note: Chief Complaint  Patient presents with  . New Patient (Initial Visit)    had some chest pain, heart skips a beat, pressure, caused a quick loss of shortness of breath    HPI: George Bailey is a 51 y.o. male with a PMH below who presents today for cardiology evaluation to evaluate periodic chest pain symptoms with shortness of breath. He had a treadmill stress test ordered and read.  He saw his PCP back on the eighth and is now referred for cardiology evaluation for persistent chest pain. He has a history of major depressive disorder and anxiety disorder along with insomnia.. It is important to note that he is recently widowed having lost his wife to what seemed to be sudden cardiac death. Apparently she had gone for evaluation for chest pain, ruled out for ACS and then several days later had chest pain and cardiac arrest. Since then he has been very stressed.  Studies Reviewed:   Exercise Treadmill Stress Test 08/04/2016: Normal blood pressure response to exercise. No EKG changes. Walk 5 minutes managing fair effort. No chest pain. LOW RISK. Achieved 92% of max protected heart rate. Workload 7 METs  Interval History: George Bailey presents here today with persistent episodes of chest discomfort which he mostly describes starting on long left sternal border radiating to the axilla. This can happen with both rest or exertion. He does indicate that it may be worse with exertion, but he starts getting very confusing with his description of symptoms at this time. Sometimes he feels that as a pressure sometimes he feels it as a tightness. Sometimes he feels it is a sense of fullness. Every now and then is associated with palpitations. He does note having some episodes of palpitations can last up to a few minutes but they don't really bother him that much. He is more concerned about the chest discomfort. He also notes exertional dyspnea as well as feeling tired and  fatigued and weak with exertion. He does deny resting dyspnea. He denies any PND, orthopnea or edema. He does get up sometimes at night to urinate with nocturia. No syncope/near syncope or TIAs/ amaurosis fugax symptoms.   No claudication.  ROS: A comprehensive was performed. Review of Systems  Constitutional: Positive for malaise/fatigue (Decreased exercise tolerance and fatigue).  HENT: Negative for congestion and nosebleeds.   Respiratory: Positive for shortness of breath (With exertion). Negative for cough and wheezing.   Cardiovascular: Positive for chest pain and palpitations.       Per history of present illness  Gastrointestinal: Negative for blood in stool and melena.  Genitourinary: Negative for hematuria.  Musculoskeletal: Negative for myalgias.  Neurological: Negative for dizziness.  Endo/Heme/Allergies: Does not bruise/bleed easily.  Psychiatric/Behavioral: Negative for memory loss. The patient has insomnia.        Significant anxiety and and grieving.  All other systems reviewed and are negative.   Past Medical History:  Diagnosis Date  . Depression    Made worse after recent loss of his wife  . GERD (gastroesophageal reflux disease)   . PONV (postoperative nausea and vomiting)   . Sleep apnea    cannot wear CPAP due to tourett's syndrome  . Tourette's disorder     Past Surgical History:  Procedure Laterality Date  . BACK SURGERY     L5-S1 fusion  . CHOLECYSTECTOMY    . COLON SURGERY  2014   colon resection due to diverticulitis  . DORSAL COMPARTMENT RELEASE  Left 11/12/2015   Procedure: LEFT WRIST DEQUERVAIN'S RELEASE;  Surgeon: Mack Hookavid Thompson, MD;  Location: [facility];  Service: Orthopedics;  Laterality: Left;  . ELBOW SURGERY Bilateral   . Exercise Treadmill Stress Test  08/04/2016   LOW RISK.  Normal blood pressure response to exercise. No EKG changes. Walk 5 minutes - fair effort. 7 METS. No chest pain.  Achieved 92% of max protected heart  rate.  . FUNCTIONAL ENDOSCOPIC SINUS SURGERY    . SHOULDER SURGERY Left   . TONSILLECTOMY    . UVULOPALATOPHARYNGOPLASTY      Current Meds  Medication Sig  . aspirin-acetaminophen-caffeine (EXCEDRIN MIGRAINE) 250-250-65 MG tablet Take by mouth every 6 (six) hours as needed for headache.  Marland Kitchen. omeprazole (PRILOSEC OTC) 20 MG tablet Take 10 mg by mouth daily.  . pseudoephedrine (SUDAFED) 30 MG tablet Take 10 mg by mouth every 4 (four) hours as needed for congestion.    Allergies  Allergen Reactions  . Adhesive [Tape] Other (See Comments)    If used on face caused rash unsure if it was the tape. But ok on other body parts  . Erythromycin Nausea Only    Social History   Social History  . Marital status: Widowed    Spouse name: N/A  . Number of children: N/A  . Years of education: N/A   Social History Main Topics  . Smoking status: Never Smoker  . Smokeless tobacco: Never Used  . Alcohol use Yes     Comment: rarely  . Drug use: No  . Sexual activity: Not Asked   Other Topics Concern  . None   Social History Narrative   Epworth Sleepiness Scale Score 12.- Would benefit from sleep study once cardiac evaluation complete       family history includes Cancer in his paternal grandfather; Colon cancer in his paternal grandmother; Diabetes in his father; Heart attack in his mother; Heart disease in his father; Lung disease in his maternal grandfather and maternal grandmother; Stroke in his maternal grandfather.  Wt Readings from Last 3 Encounters:  09/07/16 102.5 kg (226 lb)  11/12/15 96.2 kg (212 lb)    PHYSICAL EXAM BP (!) 132/92 (BP Location: Right Arm, Patient Position: Sitting, Cuff Size: Normal)   Pulse 96   Ht 5' 9.5" (1.765 m)   Wt 102.5 kg (226 lb)   BMI 32.90 kg/m  General appearance: alert, cooperative, appears stated age, no distress and Mildly obese -  HEENT: [place]/AT, EOMI, MMM, anicteric sclera Neck: no adenopathy, no carotid bruit and no JVD Lungs: clear to  auscultation bilaterally, normal percussion bilaterally and non-labored Heart: regular rate and rhythm, S1 & S2 normal, no murmur, click, rub or gallop; nondisplaced PMI Abdomen: soft, non-tender; bowel sounds normal; no masses,  no organomegaly; no HJR; obese Extremities: extremities normal, atraumatic, no cyanosis, oredema  Pulses: 2+ and symmetric;  Skin: mobility and turgor normal, no edema, no evidence of bleeding or bruising and no lesions noted or  Neurologic / psych: Mental status: Alert, oriented, thought content appropriate; he does seem quite anxious and nervous. Speaks very fast.  Cranial nerves: normal (II-XII grossly intact)    Adult ECG Report -- not checked today From PCP:   Rate: 81 ;  Rhythm: normal sinus rhythm and Normal axis, intervals and durations. "Q waves "in V1 and V2. Cannot exclude anteroseptal infarct, age undetermined.;   Narrative Interpretation: Relatively normal EKG   Other studies Reviewed: Additional studies/ records that were reviewed today include:  Recent Labs:  From PCP  Na+ 137, K+ 4.1, Cl- 100, HCO3- 28 , BUN 13, Cr 0.91, Glu 99, Ca2+ 9.9; AST 29, ALT 36, AlkP 58, Alb 4.3, TP 7.7, T Bili 0.3 -- essentially normal  CBC: W 6.3, H/H 14.5/42.6, Plt 218  TC 201, TG 209, HDL 39, LDL 121. - Mildly elevated    CXR 07/28/2015: No evidence of pneumonia or acute cardiopulmonary process.  ASSESSMENT / PLAN: Problem List Items Addressed This Visit    Chest pain with moderate risk for cardiac etiology - Primary    Persistent episodes of worrisome chest discomfort - some components are typical & others atypical. Fam Hx - mother with premature CAD-MI.  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 Significant anxiety. Normal GXT with persistent Sx - Plan Lexiscan Myoview.        Relevant Orders   Myocardial Perfusion Imaging   Heart palpitations    This point, he is more concerned of the chest pain than the palpitations. These are  not overly worrisome to him. If they get worse, we may need to reassess with a 30 day monitor, but for now will just simply monitor.      Relevant Orders   Myocardial Perfusion Imaging      Current medicines are reviewed at length with the patient today. (+/- concerns) n/a The following changes have been made: n/a  Patient Instructions  Schedule at [address] Your physician has requested that you have a lexiscan myoview. For further information please visit [email or web address] Please follow instruction sheet, as given.   No other changes at present --   Your physician recommends that you schedule a follow-up appointment in 4 weeks with Dr Herbie Baltimore.   If you need a refill on your cardiac medications before your next appointment, please call your pharmacy.    Studies Ordered:   Orders Placed This Encounter  Procedures  . Myocardial Perfusion Imaging      Bryan Lemma, M.D., M.S. Interventional Cardiologist   Pager # [phone] Phone # [phone] [address] [place], [place] [address]

## 2016-09-09 ENCOUNTER — Telehealth (HOSPITAL_COMMUNITY): Payer: Self-pay

## 2016-09-09 NOTE — Telephone Encounter (Signed)
Encounter complete. 

## 2016-09-11 ENCOUNTER — Ambulatory Visit (HOSPITAL_COMMUNITY)
Admission: RE | Admit: 2016-09-11 | Discharge: 2016-09-11 | Disposition: A | Discharge status: Home / Self Care | Payer: BLUE CROSS/BLUE SHIELD | Source: Ambulatory Visit | Attending: Cardiovascular Disease | Admitting: Cardiovascular Disease

## 2016-09-11 DIAGNOSIS — R002 Palpitations: Secondary | ICD-10-CM | POA: Diagnosis not present

## 2016-09-11 DIAGNOSIS — R079 Chest pain, unspecified: Secondary | ICD-10-CM

## 2016-09-11 LAB — MYOCARDIAL PERFUSION IMAGING
Estimated workload: 1 METS
LV dias vol: 77 mL (ref 62–150)
LV sys vol: 34 mL
Peak BP: 160 mmHg
Peak HR: 102 {beats}/min
Percent of predicted max HR: 60 %
Rest HR: 71 {beats}/min
SDS: 0
SRS: 0
SSS: 0
Stage 1 DBP: 105 mmHg
Stage 1 Grade: 0 %
Stage 1 HR: 75 {beats}/min
Stage 1 SBP: 145 mmHg
Stage 1 Speed: 0 mph
Stage 2 Grade: 0 %
Stage 2 HR: 73 {beats}/min
Stage 2 Speed: 0 mph
Stage 3 DBP: 107 mmHg
Stage 3 Grade: 0 %
Stage 3 HR: 102 {beats}/min
Stage 3 SBP: 160 mmHg
Stage 3 Speed: 0 mph
Stage 4 DBP: 108 mmHg
Stage 4 Grade: 0 %
Stage 4 HR: 81 {beats}/min
Stage 4 SBP: 138 mmHg
Stage 4 Speed: 0 mph
TID: 1.15

## 2016-09-11 MED ORDER — TECHNETIUM TC 99M TETROFOSMIN IV KIT
30.1000 | PACK | Freq: Once | INTRAVENOUS | Status: AC | PRN
  Administered 2016-09-11: 30.1 via INTRAVENOUS
  Filled 2016-09-11: qty 31

## 2016-09-11 MED ORDER — REGADENOSON 0.4 MG/5ML IV SOLN
0.4000 mg | Freq: Once | INTRAVENOUS | Status: AC
  Administered 2016-09-11: 0.4 mg via INTRAVENOUS

## 2016-09-11 MED ORDER — TECHNETIUM TC 99M TETROFOSMIN IV KIT
10.4000 | PACK | Freq: Once | INTRAVENOUS | Status: AC | PRN
  Administered 2016-09-11: 10.4 via INTRAVENOUS
  Filled 2016-09-11: qty 11

## 2016-09-11 MED ORDER — AMINOPHYLLINE 25 MG/ML IV SOLN
75.0000 mg | Freq: Once | INTRAVENOUS | Status: AC
  Administered 2016-09-11: 75 mg via INTRAVENOUS

## 2016-09-13 NOTE — Progress Notes (Signed)
Stress Test looked good!! No sign of significant Heart Artery Disease.  Pump function is normal.  Good news!!.  Darielle Hancher W, MD 

## 2016-10-05 ENCOUNTER — Ambulatory Visit: Payer: BLUE CROSS/BLUE SHIELD | Admitting: Cardiology

## 2016-10-05 DIAGNOSIS — M2550 Pain in unspecified joint: Secondary | ICD-10-CM | POA: Diagnosis not present

## 2016-10-05 DIAGNOSIS — F329 Major depressive disorder, single episode, unspecified: Secondary | ICD-10-CM | POA: Diagnosis not present

## 2016-11-20 DIAGNOSIS — R319 Hematuria, unspecified: Secondary | ICD-10-CM | POA: Diagnosis not present

## 2016-12-09 DIAGNOSIS — N401 Enlarged prostate with lower urinary tract symptoms: Secondary | ICD-10-CM | POA: Diagnosis not present

## 2016-12-09 DIAGNOSIS — R350 Frequency of micturition: Secondary | ICD-10-CM | POA: Diagnosis not present

## 2016-12-09 DIAGNOSIS — N138 Other obstructive and reflux uropathy: Secondary | ICD-10-CM | POA: Diagnosis not present

## 2016-12-09 DIAGNOSIS — R310 Gross hematuria: Secondary | ICD-10-CM | POA: Diagnosis not present

## 2016-12-23 DIAGNOSIS — N138 Other obstructive and reflux uropathy: Secondary | ICD-10-CM | POA: Diagnosis not present

## 2016-12-23 DIAGNOSIS — R310 Gross hematuria: Secondary | ICD-10-CM | POA: Diagnosis not present

## 2016-12-23 DIAGNOSIS — N401 Enlarged prostate with lower urinary tract symptoms: Secondary | ICD-10-CM | POA: Diagnosis not present

## 2017-01-11 DIAGNOSIS — M19071 Primary osteoarthritis, right ankle and foot: Secondary | ICD-10-CM | POA: Diagnosis not present

## 2017-01-11 DIAGNOSIS — M71571 Other bursitis, not elsewhere classified, right ankle and foot: Secondary | ICD-10-CM | POA: Diagnosis not present

## 2017-01-11 DIAGNOSIS — M7731 Calcaneal spur, right foot: Secondary | ICD-10-CM | POA: Diagnosis not present

## 2017-01-11 DIAGNOSIS — M76821 Posterior tibial tendinitis, right leg: Secondary | ICD-10-CM | POA: Diagnosis not present

## 2017-01-11 DIAGNOSIS — M722 Plantar fascial fibromatosis: Secondary | ICD-10-CM | POA: Diagnosis not present

## 2017-01-18 DIAGNOSIS — M722 Plantar fascial fibromatosis: Secondary | ICD-10-CM | POA: Diagnosis not present

## 2017-01-18 DIAGNOSIS — M71571 Other bursitis, not elsewhere classified, right ankle and foot: Secondary | ICD-10-CM | POA: Diagnosis not present

## 2017-05-11 DIAGNOSIS — Z23 Encounter for immunization: Secondary | ICD-10-CM | POA: Diagnosis not present

## 2017-05-11 DIAGNOSIS — F322 Major depressive disorder, single episode, severe without psychotic features: Secondary | ICD-10-CM | POA: Diagnosis not present

## 2017-06-08 DIAGNOSIS — S20212A Contusion of left front wall of thorax, initial encounter: Secondary | ICD-10-CM | POA: Diagnosis not present

## 2017-06-08 DIAGNOSIS — S60212A Contusion of left wrist, initial encounter: Secondary | ICD-10-CM | POA: Diagnosis not present

## 2017-06-08 DIAGNOSIS — M25532 Pain in left wrist: Secondary | ICD-10-CM | POA: Diagnosis not present

## 2017-07-20 DIAGNOSIS — M79672 Pain in left foot: Secondary | ICD-10-CM | POA: Diagnosis not present

## 2017-07-20 DIAGNOSIS — M79671 Pain in right foot: Secondary | ICD-10-CM | POA: Diagnosis not present

## 2017-07-20 DIAGNOSIS — M2141 Flat foot [pes planus] (acquired), right foot: Secondary | ICD-10-CM | POA: Diagnosis not present

## 2017-07-20 DIAGNOSIS — M76821 Posterior tibial tendinitis, right leg: Secondary | ICD-10-CM | POA: Diagnosis not present

## 2017-07-20 DIAGNOSIS — M25571 Pain in right ankle and joints of right foot: Secondary | ICD-10-CM | POA: Diagnosis not present

## 2017-07-20 DIAGNOSIS — M25572 Pain in left ankle and joints of left foot: Secondary | ICD-10-CM | POA: Diagnosis not present

## 2017-11-01 DIAGNOSIS — R110 Nausea: Secondary | ICD-10-CM | POA: Diagnosis not present

## 2017-11-01 DIAGNOSIS — R1084 Generalized abdominal pain: Secondary | ICD-10-CM | POA: Diagnosis not present

## 2017-11-04 DIAGNOSIS — Z9889 Other specified postprocedural states: Secondary | ICD-10-CM | POA: Diagnosis not present

## 2017-11-04 DIAGNOSIS — R109 Unspecified abdominal pain: Secondary | ICD-10-CM | POA: Diagnosis not present

## 2017-11-08 ENCOUNTER — Other Ambulatory Visit: Payer: Self-pay | Admitting: Gastroenterology

## 2017-11-08 DIAGNOSIS — R1032 Left lower quadrant pain: Secondary | ICD-10-CM | POA: Diagnosis not present

## 2017-11-08 DIAGNOSIS — R102 Pelvic and perineal pain: Secondary | ICD-10-CM

## 2017-11-11 ENCOUNTER — Other Ambulatory Visit: Payer: BLUE CROSS/BLUE SHIELD

## 2018-05-31 ENCOUNTER — Encounter (HOSPITAL_COMMUNITY): Payer: Self-pay | Admitting: *Deleted

## 2018-05-31 ENCOUNTER — Other Ambulatory Visit: Payer: Self-pay

## 2018-05-31 ENCOUNTER — Emergency Department (HOSPITAL_COMMUNITY)
Admission: EM | Admit: 2018-05-31 | Discharge: 2018-05-31 | Disposition: A | Discharge status: Home Health | Payer: BLUE CROSS/BLUE SHIELD | Attending: Emergency Medicine | Admitting: Emergency Medicine

## 2018-05-31 DIAGNOSIS — R109 Unspecified abdominal pain: Secondary | ICD-10-CM | POA: Diagnosis not present

## 2018-05-31 DIAGNOSIS — Z5321 Procedure and treatment not carried out due to patient leaving prior to being seen by health care provider: Secondary | ICD-10-CM | POA: Diagnosis not present

## 2018-05-31 LAB — COMPREHENSIVE METABOLIC PANEL
ALT: 38 U/L (ref 0–44)
AST: 36 U/L (ref 15–41)
Albumin: 4.7 g/dL (ref 3.5–5.0)
Alkaline Phosphatase: 59 U/L (ref 38–126)
Anion gap: 9 (ref 5–15)
BUN: 14 mg/dL (ref 6–20)
CO2: 27 mmol/L (ref 22–32)
Calcium: 9.5 mg/dL (ref 8.9–10.3)
Chloride: 103 mmol/L (ref 98–111)
Creatinine, Ser: 0.82 mg/dL (ref 0.61–1.24)
GFR calc Af Amer: >60 mL/min (ref >60)
GFR calc non Af Amer: >60 mL/min (ref >60)
Glucose, Bld: 107 mg/dL — ABNORMAL HIGH (ref 70–99)
Potassium: 4.1 mmol/L (ref 3.5–5.1)
Sodium: 139 mmol/L (ref 135–145)
Total Bilirubin: 0.6 mg/dL (ref 0.3–1.2)
Total Protein: 8 g/dL (ref 6.5–8.1)

## 2018-05-31 LAB — CBC
HCT: 44.7 % (ref 39.0–52.0)
Hemoglobin: 14.7 g/dL (ref 13.0–17.0)
MCH: 27.7 pg (ref 26.0–34.0)
MCHC: 32.9 g/dL (ref 30.0–36.0)
MCV: 84.3 fL (ref 80.0–100.0)
Platelets: 220 10*3/uL (ref 150–400)
RBC: 5.3 MIL/uL (ref 4.22–5.81)
RDW: 13.6 % (ref 11.5–15.5)
WBC: 7.2 10*3/uL (ref 4.0–10.5)
nRBC: 0 % (ref 0.0–0.2)

## 2018-05-31 LAB — LIPASE, BLOOD: Lipase: 29 U/L (ref 11–51)

## 2018-05-31 NOTE — ED Notes (Signed)
Per registration, patient reports he is leaving. 

## 2018-05-31 NOTE — ED Triage Notes (Signed)
Pt has a history of diverticulitis, has had surgery in past for it. Over last 3 months abd pain, gas, loose stools, nausea, night sweats, fatigue, muscle pain, etc

## 2018-05-31 NOTE — ED Notes (Signed)
Pt not answering from lobby 

## 2018-05-31 NOTE — ED Notes (Signed)
Pt did not come when called from lobby.

## 2018-06-01 ENCOUNTER — Other Ambulatory Visit: Payer: Self-pay | Admitting: Physician Assistant

## 2018-06-01 ENCOUNTER — Encounter (HOSPITAL_COMMUNITY): Payer: Self-pay | Admitting: Emergency Medicine

## 2018-06-01 ENCOUNTER — Emergency Department (HOSPITAL_COMMUNITY)
Admission: EM | Admit: 2018-06-01 | Discharge: 2018-06-01 | Discharge status: Against Medical Advice | Payer: BLUE CROSS/BLUE SHIELD | Attending: Emergency Medicine | Admitting: Emergency Medicine

## 2018-06-01 DIAGNOSIS — R110 Nausea: Secondary | ICD-10-CM | POA: Diagnosis not present

## 2018-06-01 DIAGNOSIS — R1032 Left lower quadrant pain: Secondary | ICD-10-CM | POA: Diagnosis not present

## 2018-06-01 DIAGNOSIS — R5383 Other fatigue: Secondary | ICD-10-CM | POA: Diagnosis not present

## 2018-06-01 DIAGNOSIS — R102 Pelvic and perineal pain: Secondary | ICD-10-CM | POA: Diagnosis not present

## 2018-06-01 DIAGNOSIS — R109 Unspecified abdominal pain: Secondary | ICD-10-CM | POA: Insufficient documentation

## 2018-06-01 DIAGNOSIS — R42 Dizziness and giddiness: Secondary | ICD-10-CM | POA: Diagnosis not present

## 2018-06-01 DIAGNOSIS — Z800 Family history of malignant neoplasm of digestive organs: Secondary | ICD-10-CM | POA: Diagnosis not present

## 2018-06-01 DIAGNOSIS — Z5321 Procedure and treatment not carried out due to patient leaving prior to being seen by health care provider: Secondary | ICD-10-CM | POA: Diagnosis not present

## 2018-06-01 DIAGNOSIS — R197 Diarrhea, unspecified: Secondary | ICD-10-CM | POA: Insufficient documentation

## 2018-06-01 NOTE — ED Notes (Signed)
No answer for blood draw.

## 2018-06-01 NOTE — ED Triage Notes (Signed)
Pt c/o abd pains since September with n/d, gas pains, lightheaded and fatigue. Hx diverticulitis.

## 2018-06-01 NOTE — ED Notes (Signed)
Patient called x2 for room placement with no answer. 

## 2018-06-04 ENCOUNTER — Ambulatory Visit
Admission: RE | Admit: 2018-06-04 | Discharge: 2018-06-04 | Disposition: A | Discharge status: Home / Self Care | Payer: BLUE CROSS/BLUE SHIELD | Source: Ambulatory Visit | Attending: Physician Assistant | Admitting: Physician Assistant

## 2018-06-04 DIAGNOSIS — R102 Pelvic and perineal pain: Secondary | ICD-10-CM

## 2018-06-04 DIAGNOSIS — R1032 Left lower quadrant pain: Secondary | ICD-10-CM | POA: Diagnosis not present

## 2018-06-04 DIAGNOSIS — Z800 Family history of malignant neoplasm of digestive organs: Secondary | ICD-10-CM

## 2018-06-04 MED ORDER — IOPAMIDOL (ISOVUE-300) INJECTION 61%
100.0000 mL | Freq: Once | INTRAVENOUS | Status: AC | PRN
  Administered 2018-06-04: 100 mL via INTRAVENOUS

## 2018-06-15 DIAGNOSIS — K580 Irritable bowel syndrome with diarrhea: Secondary | ICD-10-CM | POA: Diagnosis not present

## 2018-06-15 DIAGNOSIS — R1032 Left lower quadrant pain: Secondary | ICD-10-CM | POA: Diagnosis not present

## 2018-08-08 DIAGNOSIS — R339 Retention of urine, unspecified: Secondary | ICD-10-CM | POA: Diagnosis not present

## 2018-08-08 DIAGNOSIS — R350 Frequency of micturition: Secondary | ICD-10-CM | POA: Diagnosis not present

## 2018-08-08 DIAGNOSIS — R319 Hematuria, unspecified: Secondary | ICD-10-CM | POA: Diagnosis not present

## 2018-12-08 DIAGNOSIS — M25562 Pain in left knee: Secondary | ICD-10-CM | POA: Diagnosis not present

## 2018-12-21 DIAGNOSIS — M25562 Pain in left knee: Secondary | ICD-10-CM | POA: Diagnosis not present

## 2018-12-26 DIAGNOSIS — M25562 Pain in left knee: Secondary | ICD-10-CM | POA: Diagnosis not present

## 2018-12-26 DIAGNOSIS — S80252A Superficial foreign body, left knee, initial encounter: Secondary | ICD-10-CM | POA: Diagnosis not present

## 2019-01-18 DIAGNOSIS — M94262 Chondromalacia, left knee: Secondary | ICD-10-CM | POA: Diagnosis not present

## 2019-01-18 DIAGNOSIS — S80252A Superficial foreign body, left knee, initial encounter: Secondary | ICD-10-CM | POA: Diagnosis not present

## 2019-01-18 DIAGNOSIS — M6752 Plica syndrome, left knee: Secondary | ICD-10-CM | POA: Diagnosis not present

## 2019-01-18 DIAGNOSIS — M84352A Stress fracture, left femur, initial encounter for fracture: Secondary | ICD-10-CM | POA: Diagnosis not present

## 2019-01-18 DIAGNOSIS — M795 Residual foreign body in soft tissue: Secondary | ICD-10-CM | POA: Diagnosis not present

## 2019-01-18 DIAGNOSIS — M23222 Derangement of posterior horn of medial meniscus due to old tear or injury, left knee: Secondary | ICD-10-CM | POA: Diagnosis not present

## 2019-01-23 DIAGNOSIS — M25562 Pain in left knee: Secondary | ICD-10-CM | POA: Diagnosis not present

## 2019-01-29 DIAGNOSIS — Z1159 Encounter for screening for other viral diseases: Secondary | ICD-10-CM | POA: Diagnosis not present

## 2019-01-29 DIAGNOSIS — S9032XA Contusion of left foot, initial encounter: Secondary | ICD-10-CM | POA: Diagnosis not present

## 2019-01-29 DIAGNOSIS — M79672 Pain in left foot: Secondary | ICD-10-CM | POA: Diagnosis not present

## 2019-02-23 DIAGNOSIS — Z471 Aftercare following joint replacement surgery: Secondary | ICD-10-CM | POA: Diagnosis not present

## 2019-02-23 DIAGNOSIS — M25562 Pain in left knee: Secondary | ICD-10-CM | POA: Diagnosis not present

## 2019-03-15 DIAGNOSIS — M25562 Pain in left knee: Secondary | ICD-10-CM | POA: Diagnosis not present

## 2019-03-21 DIAGNOSIS — F322 Major depressive disorder, single episode, severe without psychotic features: Secondary | ICD-10-CM | POA: Diagnosis not present

## 2019-04-18 DIAGNOSIS — E291 Testicular hypofunction: Secondary | ICD-10-CM | POA: Diagnosis not present

## 2019-04-18 DIAGNOSIS — Z23 Encounter for immunization: Secondary | ICD-10-CM | POA: Diagnosis not present

## 2019-04-18 DIAGNOSIS — F322 Major depressive disorder, single episode, severe without psychotic features: Secondary | ICD-10-CM | POA: Diagnosis not present

## 2019-05-12 ENCOUNTER — Other Ambulatory Visit: Payer: Self-pay

## 2019-05-12 DIAGNOSIS — Z20822 Contact with and (suspected) exposure to covid-19: Secondary | ICD-10-CM

## 2019-05-15 LAB — NOVEL CORONAVIRUS, NAA: SARS-CoV-2, NAA: NOT DETECTED

## 2019-06-27 DIAGNOSIS — R5383 Other fatigue: Secondary | ICD-10-CM | POA: Diagnosis not present

## 2019-06-27 DIAGNOSIS — E291 Testicular hypofunction: Secondary | ICD-10-CM | POA: Diagnosis not present

## 2019-07-06 ENCOUNTER — Ambulatory Visit: Payer: BC Managed Care – PPO | Attending: Internal Medicine

## 2019-07-06 DIAGNOSIS — Z20822 Contact with and (suspected) exposure to covid-19: Secondary | ICD-10-CM

## 2019-07-08 LAB — NOVEL CORONAVIRUS, NAA: SARS-CoV-2, NAA: NOT DETECTED

## 2019-10-05 ENCOUNTER — Ambulatory Visit: Payer: BC Managed Care – PPO | Attending: Internal Medicine

## 2019-10-05 DIAGNOSIS — Z23 Encounter for immunization: Secondary | ICD-10-CM

## 2019-10-05 NOTE — Progress Notes (Signed)
   Covid-19 Vaccination Clinic  Name:  George Bailey    MRN: [id number] DOB: 08-Oct-1965  10/05/2019  [name] was observed post Covid-19 immunization for 15 minutes without incident. He was provided with Vaccine Information Sheet and instruction to access the V-Safe system.   [name] was instructed to call 911 with any severe reactions post vaccine: Marland Kitchen Difficulty breathing  . Swelling of face and throat  . A fast heartbeat  . A bad rash all over body  . Dizziness and weakness   Immunizations Administered    Name Date Dose VIS Date Route   Pfizer COVID-19 Vaccine 10/05/2019  9:04 AM 0.3 mL 06/09/2019 Intramuscular   Manufacturer: [employer], [employer]   Lot: [id number]   NDC: [address]-6

## 2019-10-30 ENCOUNTER — Ambulatory Visit: Payer: BC Managed Care – PPO | Attending: Internal Medicine

## 2019-10-30 DIAGNOSIS — Z23 Encounter for immunization: Secondary | ICD-10-CM

## 2019-10-30 NOTE — Progress Notes (Signed)
   Covid-19 Vaccination Clinic  Name:  ANUSH WIEDEMAN    MRN: [id number] DOB: 03-22-1966  10/30/2019  Mr. Demartini was observed post Covid-19 immunization for 15 minutes without incident. He was provided with Vaccine Information Sheet and instruction to access the V-Safe system.   Mr. Kluth was instructed to call 911 with any severe reactions post vaccine: Marland Kitchen Difficulty breathing  . Swelling of face and throat  . A fast heartbeat  . A bad rash all over body  . Dizziness and weakness   Immunizations Administered    Name Date Dose VIS Date Route   Pfizer COVID-19 Vaccine 10/30/2019  8:09 AM 0.3 mL 08/23/2018 Intramuscular   Manufacturer: [employer], [employer]   Lot: [id number]   NDC: [address]-1

## 2019-11-08 ENCOUNTER — Other Ambulatory Visit: Payer: Self-pay

## 2019-11-08 ENCOUNTER — Emergency Department (HOSPITAL_COMMUNITY): Payer: BC Managed Care – PPO

## 2019-11-08 ENCOUNTER — Emergency Department (HOSPITAL_COMMUNITY)
Admission: EM | Admit: 2019-11-08 | Discharge: 2019-11-08 | Disposition: A | Discharge status: Home / Self Care | Payer: BC Managed Care – PPO | Attending: Emergency Medicine | Admitting: Emergency Medicine

## 2019-11-08 ENCOUNTER — Encounter (HOSPITAL_COMMUNITY): Payer: Self-pay | Admitting: Emergency Medicine

## 2019-11-08 DIAGNOSIS — K219 Gastro-esophageal reflux disease without esophagitis: Secondary | ICD-10-CM | POA: Diagnosis not present

## 2019-11-08 DIAGNOSIS — R0602 Shortness of breath: Secondary | ICD-10-CM | POA: Diagnosis not present

## 2019-11-08 DIAGNOSIS — R42 Dizziness and giddiness: Secondary | ICD-10-CM | POA: Insufficient documentation

## 2019-11-08 DIAGNOSIS — R0789 Other chest pain: Secondary | ICD-10-CM | POA: Insufficient documentation

## 2019-11-08 DIAGNOSIS — R1010 Upper abdominal pain, unspecified: Secondary | ICD-10-CM | POA: Insufficient documentation

## 2019-11-08 DIAGNOSIS — R079 Chest pain, unspecified: Secondary | ICD-10-CM | POA: Diagnosis not present

## 2019-11-08 LAB — HEPATIC FUNCTION PANEL
ALT: 47 U/L — ABNORMAL HIGH (ref 0–44)
AST: 46 U/L — ABNORMAL HIGH (ref 15–41)
Albumin: 4.3 g/dL (ref 3.5–5.0)
Alkaline Phosphatase: 65 U/L (ref 38–126)
Bilirubin, Direct: 0.1 mg/dL (ref 0.0–0.2)
Indirect Bilirubin: 0.4 mg/dL (ref 0.3–0.9)
Total Bilirubin: 0.5 mg/dL (ref 0.3–1.2)
Total Protein: 7.9 g/dL (ref 6.5–8.1)

## 2019-11-08 LAB — CBC
HCT: 43.9 % (ref 39.0–52.0)
Hemoglobin: 14.6 g/dL (ref 13.0–17.0)
MCH: 27 pg (ref 26.0–34.0)
MCHC: 33.3 g/dL (ref 30.0–36.0)
MCV: 81.3 fL (ref 80.0–100.0)
Platelets: 212 10*3/uL (ref 150–400)
RBC: 5.4 MIL/uL (ref 4.22–5.81)
RDW: 14.2 % (ref 11.5–15.5)
WBC: 5.6 10*3/uL (ref 4.0–10.5)
nRBC: 0 % (ref 0.0–0.2)

## 2019-11-08 LAB — BASIC METABOLIC PANEL
Anion gap: 9 (ref 5–15)
BUN: 18 mg/dL (ref 6–20)
CO2: 23 mmol/L (ref 22–32)
Calcium: 9.2 mg/dL (ref 8.9–10.3)
Chloride: 106 mmol/L (ref 98–111)
Creatinine, Ser: 1.02 mg/dL (ref 0.61–1.24)
GFR calc Af Amer: >60 mL/min (ref >60)
GFR calc non Af Amer: >60 mL/min (ref >60)
Glucose, Bld: 128 mg/dL — ABNORMAL HIGH (ref 70–99)
Potassium: 4 mmol/L (ref 3.5–5.1)
Sodium: 138 mmol/L (ref 135–145)

## 2019-11-08 LAB — TROPONIN I (HIGH SENSITIVITY)
Troponin I (High Sensitivity): 2 ng/L (ref <18)
Troponin I (High Sensitivity): 3 ng/L (ref <18)

## 2019-11-08 LAB — LIPASE, BLOOD: Lipase: 23 U/L (ref 11–51)

## 2019-11-08 NOTE — ED Triage Notes (Signed)
Per pt, sates he has been feeling light headed, SOB on and off for a couple of weeks-some central chest discomfort, clammy and diaphoretic-states not currently being treated for HTN-

## 2019-11-08 NOTE — ED Provider Notes (Signed)
[facility]-EMERGENCY DEPT Provider Note   CSN: [id number] Arrival date & time: 11/08/19  1516     History Chief Complaint  Patient presents with  . Shortness of Breath  . Chest Pain    DRU PRIMEAU is a 54 y.o. male.  HPI  Patient presents with shortness of breath and lightheadedness.  Has been going for the last couple weeks.  States he has been having trouble walking around the store.  No fevers.  At times has chest tightness.  States sometimes it is better than others.  No swelling in his legs.  States he has had a previous cardiac work-up that was negative.  No fevers.  States he does get sweaty.  States that comes on sometimes at rest and does not necessarily come on with the exertion or difficulty breathing.  States at times he also feel bad in his upper abdomen.       Past Medical History:  Diagnosis Date  . Depression    Made worse after recent loss of his wife  . GERD (gastroesophageal reflux disease)   . PONV (postoperative nausea and vomiting)   . Sleep apnea    cannot wear CPAP due to tourett's syndrome  . Tourette's disorder     Patient Active Problem List   Diagnosis Date Noted  . Chest pain with moderate risk for cardiac etiology 09/07/2016  . Heart palpitations 09/07/2016    Past Surgical History:  Procedure Laterality Date  . BACK SURGERY     L5-S1 fusion  . CHOLECYSTECTOMY    . COLON SURGERY  2014   colon resection due to diverticulitis  . DORSAL COMPARTMENT RELEASE Left 11/12/2015   Procedure: LEFT WRIST DEQUERVAIN'S RELEASE;  Surgeon: Mack Hook, MD;  Location: [facility];  Service: Orthopedics;  Laterality: Left;  . ELBOW SURGERY Bilateral   . Exercise Treadmill Stress Test  08/04/2016   LOW RISK.  Normal blood pressure response to exercise. No EKG changes. Walk 5 minutes - fair effort. 7 METS. No chest pain.  Achieved 92% of max protected heart rate.  . FUNCTIONAL ENDOSCOPIC SINUS SURGERY    .  SHOULDER SURGERY Left   . TONSILLECTOMY    . UVULOPALATOPHARYNGOPLASTY         Family History  Problem Relation Age of Onset  . Heart attack Mother   . Diabetes Father   . Heart disease Father   . Lung disease Maternal Grandmother   . Lung disease Maternal Grandfather   . Stroke Maternal Grandfather   . Colon cancer Paternal Grandmother   . Cancer Paternal Grandfather     Social History   Tobacco Use  . Smoking status: Never Smoker  . Smokeless tobacco: Never Used  Substance Use Topics  . Alcohol use: Yes    Comment: rarely  . Drug use: No    Home Medications Prior to Admission medications   Medication Sig Start Date End Date Taking? Authorizing Provider  aspirin-acetaminophen-caffeine (EXCEDRIN MIGRAINE) [id number] MG tablet Take by mouth every 6 (six) hours as needed for headache.    [provider]  omeprazole (PRILOSEC OTC) 20 MG tablet Take 10 mg by mouth daily.    [provider]  pseudoephedrine (SUDAFED) 30 MG tablet Take 10 mg by mouth every 4 (four) hours as needed for congestion.    [provider]    Allergies    Adhesive [tape] and Erythromycin  Review of Systems   Review of Systems  Constitutional: Positive  for diaphoresis and fatigue. Negative for appetite change.  HENT: Negative for congestion.   Respiratory: Positive for shortness of breath.   Cardiovascular: Positive for chest pain. Negative for leg swelling.  Gastrointestinal: Negative for abdominal distention.  Genitourinary: Negative for frequency.  Musculoskeletal: Negative for back pain.  Neurological: Negative for light-headedness.  Psychiatric/Behavioral: Negative for behavioral problems.    Physical Exam Updated Vital Signs BP 107/80   Pulse 97   Temp 98.2 F (36.8 C) (Oral)   Resp 16   SpO2 98%   Physical Exam Vitals and nursing note reviewed.  Constitutional:      Appearance: He is well-developed.  HENT:     Head: Normocephalic.   Cardiovascular:     Rate and Rhythm: Normal rate and regular rhythm.  Pulmonary:     Breath sounds: No wheezing, rhonchi or rales.  Chest:     Chest wall: No tenderness.  Abdominal:     Tenderness: There is no abdominal tenderness.  Musculoskeletal:     Right lower leg: No edema.     Left lower leg: No edema.  Skin:    General: Skin is warm.     Capillary Refill: Capillary refill takes less than 2 seconds.  Neurological:     Mental Status: He is alert.     ED Results / Procedures / Treatments   Labs (all labs ordered are listed, but only abnormal results are displayed) Labs Reviewed  CBC  BASIC METABOLIC PANEL  HEPATIC FUNCTION PANEL  LIPASE, BLOOD  TROPONIN I (HIGH SENSITIVITY)    EKG EKG Interpretation  Date/Time:  Wednesday Nov 08 2019 15:28:07 EDT Ventricular Rate:  101 PR Interval:    QRS Duration: 75 QT Interval:  337 QTC Calculation: 437 R Axis:   22 Text Interpretation: Sinus tachycardia 12 Lead; Mason-Likar Confirmed by Davonna Belling [phone]) on 11/08/2019 3:48:43 PM   Radiology No results found.  Procedures Procedures (including critical care time)  Medications Ordered in ED Medications - No data to display  ED Course  I have reviewed the triage vital signs and the nursing notes.  Pertinent labs & imaging results that were available during my care of the patient were reviewed by me and considered in my medical decision making (see chart for details).    MDM Rules/Calculators/A&P                      Patient with shortness of breath.  Upper abdominal pain at times.  EKG reassuring.  Chest x-ray and lab work pending.  3 years ago had negative cardiac stress test.  Care will be turned over to Dr. Sedonia Small. Final Clinical Impression(s) / ED Diagnoses Final diagnoses:  None    Rx / DC Orders ED Discharge Orders    None       Davonna Belling, MD 11/08/19 [phone]

## 2019-11-08 NOTE — ED Provider Notes (Addendum)
  Provider Note MRN:  [id number]  Arrival date & time: 11/08/19    ED Course and Medical Decision Making  Assumed care from Dr. Rubin Payor at shift change.  Weeks of intermittent shortness of breath and chest discomfort, atypical, awaiting laboratory assessment, if negative work-up and continuing to look and feel well is a candidate for discharge with close PCP follow-up.  Work-up reassuring, suspect overworking or stress contributing to patient's symptoms, PERC negative with no PE risk factors, advised PCP follow-up.  Procedures  Final Clinical Impressions(s) / ED Diagnoses     ICD-10-CM   1. Shortness of breath  R06.02     ED Discharge Orders    None        Discharge Instructions     You were evaluated in the Emergency Department and after careful evaluation, we did not find any emergent condition requiring admission or further testing in the hospital.  Your exam/testing today was overall reassuring.  Please return to the Emergency Department if you experience any worsening of your condition.  We encourage you to follow up with a primary care provider.  Thank you for allowing [place] to be a part of your care.      Elmer Sow. Pilar Plate, MD [facility] Emergency Medicine [facility] [email or web address]    Sabas Sous, MD 11/08/19 Lauretta Grill, MD 11/08/19 Kristopher Oppenheim

## 2019-11-08 NOTE — Discharge Instructions (Addendum)
You were evaluated in the Emergency Department and after careful evaluation, we did not find any emergent condition requiring admission or further testing in the hospital. ° °Your exam/testing today was overall reassuring. ° °Please return to the Emergency Department if you experience any worsening of your condition.  We encourage you to follow up with a primary care provider.  Thank you for allowing us to be a part of your care. ° °

## 2019-11-23 DIAGNOSIS — Z8719 Personal history of other diseases of the digestive system: Secondary | ICD-10-CM | POA: Diagnosis not present

## 2019-11-23 DIAGNOSIS — R109 Unspecified abdominal pain: Secondary | ICD-10-CM | POA: Diagnosis not present

## 2019-11-23 DIAGNOSIS — K219 Gastro-esophageal reflux disease without esophagitis: Secondary | ICD-10-CM | POA: Diagnosis not present

## 2020-03-21 DIAGNOSIS — M79674 Pain in right toe(s): Secondary | ICD-10-CM | POA: Diagnosis not present

## 2020-05-27 DIAGNOSIS — Z20822 Contact with and (suspected) exposure to covid-19: Secondary | ICD-10-CM | POA: Diagnosis not present

## 2020-05-27 DIAGNOSIS — J3089 Other allergic rhinitis: Secondary | ICD-10-CM | POA: Diagnosis not present

## 2020-06-24 DIAGNOSIS — Z20822 Contact with and (suspected) exposure to covid-19: Secondary | ICD-10-CM | POA: Diagnosis not present

## 2020-06-27 DIAGNOSIS — J019 Acute sinusitis, unspecified: Secondary | ICD-10-CM | POA: Diagnosis not present

## 2020-06-27 DIAGNOSIS — Z20822 Contact with and (suspected) exposure to covid-19: Secondary | ICD-10-CM | POA: Diagnosis not present

## 2020-07-02 ENCOUNTER — Ambulatory Visit (HOSPITAL_COMMUNITY)
Admission: EM | Admit: 2020-07-02 | Discharge: 2020-07-02 | Discharge status: Against Medical Advice | Payer: BC Managed Care – PPO

## 2020-07-02 ENCOUNTER — Other Ambulatory Visit: Payer: Self-pay

## 2020-07-02 DIAGNOSIS — J329 Chronic sinusitis, unspecified: Secondary | ICD-10-CM | POA: Diagnosis not present

## 2020-07-02 DIAGNOSIS — J40 Bronchitis, not specified as acute or chronic: Secondary | ICD-10-CM | POA: Diagnosis not present

## 2020-07-02 DIAGNOSIS — H6090 Unspecified otitis externa, unspecified ear: Secondary | ICD-10-CM | POA: Diagnosis not present

## 2020-07-02 DIAGNOSIS — R059 Cough, unspecified: Secondary | ICD-10-CM | POA: Diagnosis not present

## 2020-07-25 DIAGNOSIS — Z1152 Encounter for screening for COVID-19: Secondary | ICD-10-CM | POA: Diagnosis not present

## 2020-07-30 DIAGNOSIS — S93601A Unspecified sprain of right foot, initial encounter: Secondary | ICD-10-CM | POA: Diagnosis not present

## 2020-07-30 DIAGNOSIS — M79671 Pain in right foot: Secondary | ICD-10-CM | POA: Diagnosis not present

## 2020-08-20 ENCOUNTER — Emergency Department (HOSPITAL_COMMUNITY)
Admission: EM | Admit: 2020-08-20 | Discharge: 2020-08-20 | Disposition: A | Discharge status: Home / Self Care | Payer: BC Managed Care – PPO | Attending: Emergency Medicine | Admitting: Emergency Medicine

## 2020-08-20 ENCOUNTER — Emergency Department (HOSPITAL_COMMUNITY): Payer: BC Managed Care – PPO

## 2020-08-20 ENCOUNTER — Other Ambulatory Visit: Payer: Self-pay

## 2020-08-20 ENCOUNTER — Encounter (HOSPITAL_COMMUNITY): Payer: Self-pay

## 2020-08-20 DIAGNOSIS — R002 Palpitations: Secondary | ICD-10-CM

## 2020-08-20 DIAGNOSIS — R079 Chest pain, unspecified: Secondary | ICD-10-CM | POA: Diagnosis not present

## 2020-08-20 DIAGNOSIS — R55 Syncope and collapse: Secondary | ICD-10-CM | POA: Diagnosis not present

## 2020-08-20 LAB — URINALYSIS, ROUTINE W REFLEX MICROSCOPIC
Bilirubin Urine: NEGATIVE
Glucose, UA: NEGATIVE mg/dL
Hgb urine dipstick: NEGATIVE
Ketones, ur: 5 mg/dL — AB
Leukocytes,Ua: NEGATIVE
Nitrite: NEGATIVE
Protein, ur: 30 mg/dL — AB
Specific Gravity, Urine: 1.03 (ref 1.005–1.030)
pH: 7 (ref 5.0–8.0)

## 2020-08-20 LAB — BASIC METABOLIC PANEL
Anion gap: 10 (ref 5–15)
BUN: 12 mg/dL (ref 6–20)
CO2: 25 mmol/L (ref 22–32)
Calcium: 9.8 mg/dL (ref 8.9–10.3)
Chloride: 102 mmol/L (ref 98–111)
Creatinine, Ser: 0.94 mg/dL (ref 0.61–1.24)
GFR, Estimated: >60 mL/min (ref >60)
Glucose, Bld: 99 mg/dL (ref 70–99)
Potassium: 4.1 mmol/L (ref 3.5–5.1)
Sodium: 137 mmol/L (ref 135–145)

## 2020-08-20 LAB — CBC
HCT: 45.3 % (ref 39.0–52.0)
Hemoglobin: 15.2 g/dL (ref 13.0–17.0)
MCH: 27.4 pg (ref 26.0–34.0)
MCHC: 33.6 g/dL (ref 30.0–36.0)
MCV: 81.8 fL (ref 80.0–100.0)
Platelets: 183 10*3/uL (ref 150–400)
RBC: 5.54 MIL/uL (ref 4.22–5.81)
RDW: 14.6 % (ref 11.5–15.5)
WBC: 5.3 10*3/uL (ref 4.0–10.5)
nRBC: 0 % (ref 0.0–0.2)

## 2020-08-20 LAB — CBG MONITORING, ED: Glucose-Capillary: 96 mg/dL (ref 70–99)

## 2020-08-20 LAB — TROPONIN I (HIGH SENSITIVITY): Troponin I (High Sensitivity): 6 ng/L (ref <18)

## 2020-08-20 NOTE — ED Notes (Signed)
Care assumed. Report received from Ellen, RN 

## 2020-08-20 NOTE — ED Triage Notes (Signed)
Patient reports that he has had intermittent dizziness x 1 year. Today, the patient states he began having dizziness and "feeling like he was passing out." Patient had leaned over and was picking up a box from the floor.

## 2020-08-20 NOTE — ED Provider Notes (Signed)
[facility]-EMERGENCY DEPT Provider Note   CSN: [id number] Arrival date & time: 08/20/20  1444     History Chief Complaint  Patient presents with  . Near Syncope  . Palpitations    George Bailey is a 55 y.o. male.  Presents to ER with concern for nursing fall episode.  Patient reports that yesterday he had an episode of feeling lightheaded, dizzy and felt like he was going to pass out.  Occurred after he stood up suddenly.  He also had some associated palpitations, some of the symptoms seem to linger but then steadily resolved.  He now has no ongoing symptoms.  Did not have any associated chest pain or discomfort.  No shortness of breath.  States he has had prior episodes of palpitations.  He did not have any room spinning sensation, speech, vision, numbness or weakness.  He denies any chronic medical conditions.  HPI     Past Medical History:  Diagnosis Date  . Depression    Made worse after recent loss of his wife  . GERD (gastroesophageal reflux disease)   . PONV (postoperative nausea and vomiting)   . Sleep apnea    cannot wear CPAP due to tourett's syndrome  . Tourette's disorder     Patient Active Problem List   Diagnosis Date Noted  . Chest pain with moderate risk for cardiac etiology 09/07/2016  . Heart palpitations 09/07/2016    Past Surgical History:  Procedure Laterality Date  . BACK SURGERY     L5-S1 fusion  . CHOLECYSTECTOMY    . COLON SURGERY  2014   colon resection due to diverticulitis  . DORSAL COMPARTMENT RELEASE Left 11/12/2015   Procedure: LEFT WRIST DEQUERVAIN'S RELEASE;  Surgeon: Mack Hook, MD;  Location: [facility];  Service: Orthopedics;  Laterality: Left;  . ELBOW SURGERY Bilateral   . Exercise Treadmill Stress Test  08/04/2016   LOW RISK.  Normal blood pressure response to exercise. No EKG changes. Walk 5 minutes - fair effort. 7 METS. No chest pain.  Achieved 92% of max protected heart rate.  .  FUNCTIONAL ENDOSCOPIC SINUS SURGERY    . SHOULDER SURGERY Left   . TONSILLECTOMY    . UVULOPALATOPHARYNGOPLASTY         Family History  Problem Relation Age of Onset  . Heart attack Mother   . Diabetes Father   . Heart disease Father   . Lung disease Maternal Grandmother   . Lung disease Maternal Grandfather   . Stroke Maternal Grandfather   . Colon cancer Paternal Grandmother   . Cancer Paternal Grandfather     Social History   Tobacco Use  . Smoking status: Never Smoker  . Smokeless tobacco: Never Used  Vaping Use  . Vaping Use: Never used  Substance Use Topics  . Alcohol use: Yes    Comment: rarely  . Drug use: No    Home Medications Prior to Admission medications   Medication Sig Start Date End Date Taking? Authorizing Provider  aspirin-acetaminophen-caffeine (EXCEDRIN MIGRAINE) [phone] MG tablet Take by mouth every 6 (six) hours as needed for headache.    [provider]  omeprazole (PRILOSEC OTC) 20 MG tablet Take 10 mg by mouth daily.    [provider]  pseudoephedrine (SUDAFED) 30 MG tablet Take 10 mg by mouth every 4 (four) hours as needed for congestion.    [provider]    Allergies    Adhesive [tape] and Erythromycin  Review of  Systems   Review of Systems  Constitutional: Negative for chills and fever.  HENT: Negative for ear pain and sore throat.   Eyes: Negative for pain and visual disturbance.  Respiratory: Negative for cough and shortness of breath.   Cardiovascular: Negative for chest pain and palpitations.  Gastrointestinal: Negative for abdominal pain and vomiting.  Genitourinary: Negative for dysuria and hematuria.  Musculoskeletal: Negative for arthralgias and back pain.  Skin: Negative for color change and rash.  Neurological: Positive for syncope and light-headedness. Negative for seizures.  All other systems reviewed and are negative.   Physical Exam Updated Vital Signs BP (!) 129/92   Pulse (!) 59    Temp 98.1 F (36.7 C) (Oral)   Resp 17   Ht 5\' 10"  (1.778 m)   Wt 91.2 kg   SpO2 100%   BMI 28.84 kg/m   Physical Exam Vitals and nursing note reviewed.  Constitutional:      Appearance: He is well-developed and well-nourished.  HENT:     Head: Normocephalic and atraumatic.  Eyes:     Conjunctiva/sclera: Conjunctivae normal.  Cardiovascular:     Rate and Rhythm: Normal rate and regular rhythm.     Heart sounds: No murmur heard.   Pulmonary:     Effort: Pulmonary effort is normal. No respiratory distress.     Breath sounds: Normal breath sounds.  Abdominal:     Palpations: Abdomen is soft.     Tenderness: There is no abdominal tenderness.  Musculoskeletal:        General: No edema.     Cervical back: Neck supple.  Skin:    General: Skin is warm and dry.  Neurological:     Mental Status: He is alert.     Comments: AAOx3 CN 2-12 intact, speech clear visual fields intact 5/5 strength in b/l UE and LE Sensation to light touch intact in b/l UE and LE Normal FNF Normal gait  Psychiatric:        Mood and Affect: Mood and affect normal.     ED Results / Procedures / Treatments   Labs (all labs ordered are listed, but only abnormal results are displayed) Labs Reviewed  URINALYSIS, ROUTINE W REFLEX MICROSCOPIC - Abnormal; Notable for the following components:      Result Value   Color, Urine AMBER (*)    APPearance HAZY (*)    Ketones, ur 5 (*)    Protein, ur 30 (*)    Bacteria, UA MANY (*)    Crystals PRESENT (*)    All other components within normal limits  BASIC METABOLIC PANEL  CBC  CBG MONITORING, ED  TROPONIN I (HIGH SENSITIVITY)    EKG EKG Interpretation  Date/Time:  Tuesday August 20 2020 15:08:03 EST Ventricular Rate:  93 PR Interval:    QRS Duration: 74 QT Interval:  333 QTC Calculation: 415 R Axis:   43 Text Interpretation: Sinus rhythm Since last tracing rate slower Otherwise no significant change Confirmed by 05-31[birth year]1993 [phone]) on  08/20/2020 3:14:48 PM   Radiology DG Chest 2 View  Result Date: 08/20/2020 CLINICAL DATA:  Near syncope and chest pain EXAM: CHEST - 2 VIEW COMPARISON:  11/08/2019 FINDINGS: The heart size and mediastinal contours are within normal limits. Both lungs are clear. The visualized skeletal structures are unremarkable. IMPRESSION: No active cardiopulmonary disease. Electronically Signed   By: 01/08/2020 M.D.   On: 08/20/2020 21:39    Procedures Procedures   Medications Ordered in ED Medications - No  data to display  ED Course  I have reviewed the triage vital signs and the nursing notes.  Pertinent labs & imaging results that were available during my care of the patient were reviewed by me and considered in my medical decision making (see chart for details).    MDM Rules/Calculators/A&P                          55 year old male presents to ER after he had an episode of palpitations and near syncope.  On physical exam, patient is noted to be well-appearing in no distress, his vital signs are stable.  EKG without acute ischemic change and troponin is within normal limits.  Basic labs are stable.  CXR negative.  No events on telemetry monitoring.  Given work-up and no ongoing symptoms, believe he can be managed in the outpatient setting.  Recommended follow-up with his primary doctor, as well as cardiologist who he had seen previously for palpitations.  After the discussed management above, the patient was determined to be safe for discharge.  The patient was in agreement with this plan and all questions regarding their care were answered.  ED return precautions were discussed and the patient will return to the ED with any significant worsening of condition.  Final Clinical Impression(s) / ED Diagnoses Final diagnoses:  Palpitations  Near syncope    Rx / DC Orders ED Discharge Orders    None       Milagros Loll, MD 08/21/20 [phone]

## 2020-08-20 NOTE — Discharge Instructions (Addendum)
Strongly recommend following up with both your cardiologist and your primary care doctor.  You would likely benefit from having a heart monitor given your episodes of palpitations.  If you have any worsening palpitations, any episodes of passing out, chest pain or difficulty in breathing, return to ER for reassessment.

## 2020-10-01 DIAGNOSIS — M79671 Pain in right foot: Secondary | ICD-10-CM | POA: Diagnosis not present

## 2020-10-16 DIAGNOSIS — M2012 Hallux valgus (acquired), left foot: Secondary | ICD-10-CM | POA: Diagnosis not present

## 2020-10-16 DIAGNOSIS — M2011 Hallux valgus (acquired), right foot: Secondary | ICD-10-CM | POA: Diagnosis not present

## 2020-11-14 DIAGNOSIS — G8929 Other chronic pain: Secondary | ICD-10-CM | POA: Diagnosis not present

## 2020-11-14 DIAGNOSIS — H9201 Otalgia, right ear: Secondary | ICD-10-CM | POA: Diagnosis not present

## 2020-11-14 DIAGNOSIS — H7291 Unspecified perforation of tympanic membrane, right ear: Secondary | ICD-10-CM | POA: Diagnosis not present

## 2020-11-20 DIAGNOSIS — H9201 Otalgia, right ear: Secondary | ICD-10-CM | POA: Diagnosis not present

## 2020-11-20 DIAGNOSIS — H73891 Other specified disorders of tympanic membrane, right ear: Secondary | ICD-10-CM | POA: Diagnosis not present

## 2020-11-20 DIAGNOSIS — H9041 Sensorineural hearing loss, unilateral, right ear, with unrestricted hearing on the contralateral side: Secondary | ICD-10-CM | POA: Diagnosis not present

## 2021-04-01 ENCOUNTER — Emergency Department (HOSPITAL_BASED_OUTPATIENT_CLINIC_OR_DEPARTMENT_OTHER)
Admission: EM | Admit: 2021-04-01 | Discharge: 2021-04-01 | Disposition: A | Discharge status: Home / Self Care | Payer: BC Managed Care – PPO | Attending: Emergency Medicine | Admitting: Emergency Medicine

## 2021-04-01 ENCOUNTER — Encounter (HOSPITAL_BASED_OUTPATIENT_CLINIC_OR_DEPARTMENT_OTHER): Payer: Self-pay | Admitting: *Deleted

## 2021-04-01 ENCOUNTER — Emergency Department (HOSPITAL_BASED_OUTPATIENT_CLINIC_OR_DEPARTMENT_OTHER): Payer: BC Managed Care – PPO

## 2021-04-01 ENCOUNTER — Other Ambulatory Visit: Payer: Self-pay

## 2021-04-01 DIAGNOSIS — R197 Diarrhea, unspecified: Secondary | ICD-10-CM | POA: Insufficient documentation

## 2021-04-01 DIAGNOSIS — R531 Weakness: Secondary | ICD-10-CM | POA: Insufficient documentation

## 2021-04-01 DIAGNOSIS — Z7982 Long term (current) use of aspirin: Secondary | ICD-10-CM | POA: Diagnosis not present

## 2021-04-01 DIAGNOSIS — R5383 Other fatigue: Secondary | ICD-10-CM | POA: Insufficient documentation

## 2021-04-01 DIAGNOSIS — R630 Anorexia: Secondary | ICD-10-CM | POA: Diagnosis not present

## 2021-04-01 DIAGNOSIS — R1032 Left lower quadrant pain: Secondary | ICD-10-CM | POA: Diagnosis not present

## 2021-04-01 DIAGNOSIS — R110 Nausea: Secondary | ICD-10-CM | POA: Insufficient documentation

## 2021-04-01 DIAGNOSIS — R1030 Lower abdominal pain, unspecified: Secondary | ICD-10-CM | POA: Diagnosis not present

## 2021-04-01 DIAGNOSIS — R000 Tachycardia, unspecified: Secondary | ICD-10-CM | POA: Diagnosis not present

## 2021-04-01 DIAGNOSIS — N401 Enlarged prostate with lower urinary tract symptoms: Secondary | ICD-10-CM | POA: Diagnosis not present

## 2021-04-01 LAB — COMPREHENSIVE METABOLIC PANEL
ALT: 22 U/L (ref 0–44)
AST: 21 U/L (ref 15–41)
Albumin: 4.3 g/dL (ref 3.5–5.0)
Alkaline Phosphatase: 50 U/L (ref 38–126)
Anion gap: 10 (ref 5–15)
BUN: 12 mg/dL (ref 6–20)
CO2: 26 mmol/L (ref 22–32)
Calcium: 9.7 mg/dL (ref 8.9–10.3)
Chloride: 102 mmol/L (ref 98–111)
Creatinine, Ser: 0.89 mg/dL (ref 0.61–1.24)
GFR, Estimated: >60 mL/min (ref >60)
Glucose, Bld: 91 mg/dL (ref 70–99)
Potassium: 3.4 mmol/L — ABNORMAL LOW (ref 3.5–5.1)
Sodium: 138 mmol/L (ref 135–145)
Total Bilirubin: 0.5 mg/dL (ref 0.3–1.2)
Total Protein: 7.4 g/dL (ref 6.5–8.1)

## 2021-04-01 LAB — CBC
HCT: 44.3 % (ref 39.0–52.0)
Hemoglobin: 15.2 g/dL (ref 13.0–17.0)
MCH: 27.6 pg (ref 26.0–34.0)
MCHC: 34.3 g/dL (ref 30.0–36.0)
MCV: 80.5 fL (ref 80.0–100.0)
Platelets: 142 10*3/uL — ABNORMAL LOW (ref 150–400)
RBC: 5.5 MIL/uL (ref 4.22–5.81)
RDW: 13.9 % (ref 11.5–15.5)
WBC: 6 10*3/uL (ref 4.0–10.5)
nRBC: 0 % (ref 0.0–0.2)

## 2021-04-01 LAB — URINALYSIS, ROUTINE W REFLEX MICROSCOPIC
Bilirubin Urine: NEGATIVE
Glucose, UA: NEGATIVE mg/dL
Hgb urine dipstick: NEGATIVE
Ketones, ur: NEGATIVE mg/dL
Leukocytes,Ua: NEGATIVE
Nitrite: NEGATIVE
Protein, ur: 30 mg/dL — AB
Specific Gravity, Urine: 1.032 — ABNORMAL HIGH (ref 1.005–1.030)
pH: 8 (ref 5.0–8.0)

## 2021-04-01 LAB — LIPASE, BLOOD: Lipase: 16 U/L (ref 11–51)

## 2021-04-01 MED ORDER — MORPHINE SULFATE (PF) 4 MG/ML IV SOLN
4.0000 mg | Freq: Once | INTRAVENOUS | Status: AC
  Administered 2021-04-01: 4 mg via INTRAVENOUS
  Filled 2021-04-01: qty 1

## 2021-04-01 MED ORDER — IOHEXOL 300 MG/ML  SOLN
80.0000 mL | Freq: Once | INTRAMUSCULAR | Status: AC | PRN
  Administered 2021-04-01: 80 mL via INTRAVENOUS

## 2021-04-01 NOTE — ED Provider Notes (Signed)
[facility] EMERGENCY DEPT Provider Note   CSN: [id number] Arrival date & time: 04/01/21  1143     History Chief Complaint  Patient presents with   Abdominal Pain    George Bailey is a 55 y.o. male with PMHx GERD who presents to the ED today with complaint of gradual onset, constant, sharp, LLQ abdominal pain that began 4-5 days ago.  Patient also complains of generalized weakness, fatigue, loss of appetite, nausea, and diarrhea. He reports history of bowel resection/diverticulitis several years ago and states this pain feels somewhat similar.  He went to urgent care today who advised to come to the ED for further evaluation.  He has not been taking anything specifically for his pain.  He denies any fevers or chills. No recent antibiotic use.   The history is provided by the patient and medical records.      Past Medical History:  Diagnosis Date   Depression    Made worse after recent loss of his wife   GERD (gastroesophageal reflux disease)    PONV (postoperative nausea and vomiting)    Sleep apnea    cannot wear CPAP due to tourett's syndrome   Tourette's disorder     Patient Active Problem List   Diagnosis Date Noted   Chest pain with moderate risk for cardiac etiology 09/07/2016   Heart palpitations 09/07/2016    Past Surgical History:  Procedure Laterality Date   BACK SURGERY     L5-S1 fusion   CHOLECYSTECTOMY     COLON SURGERY  2014   colon resection due to diverticulitis   DORSAL COMPARTMENT RELEASE Left 11/12/2015   Procedure: LEFT WRIST DEQUERVAIN'S RELEASE;  Surgeon: Mack Hook, MD;  Location: [facility];  Service: Orthopedics;  Laterality: Left;   ELBOW SURGERY Bilateral    Exercise Treadmill Stress Test  08/04/2016   LOW RISK.  Normal blood pressure response to exercise. No EKG changes. Walk 5 minutes - fair effort. 7 METS. No chest pain.  Achieved 92% of max protected heart rate.   FUNCTIONAL ENDOSCOPIC SINUS SURGERY      SHOULDER SURGERY Left    TONSILLECTOMY     UVULOPALATOPHARYNGOPLASTY         Family History  Problem Relation Age of Onset   Heart attack Mother    Diabetes Father    Heart disease Father    Lung disease Maternal Grandmother    Lung disease Maternal Grandfather    Stroke Maternal Grandfather    Colon cancer Paternal Grandmother    Cancer Paternal Grandfather     Social History   Tobacco Use   Smoking status: Never   Smokeless tobacco: Never  Vaping Use   Vaping Use: Never used  Substance Use Topics   Alcohol use: Yes    Comment: rarely   Drug use: No    Home Medications Prior to Admission medications   Medication Sig Start Date End Date Taking? Authorizing Provider  aspirin-acetaminophen-caffeine (EXCEDRIN MIGRAINE) [phone] MG tablet Take by mouth every 6 (six) hours as needed for headache.    [provider]  omeprazole (PRILOSEC OTC) 20 MG tablet Take 10 mg by mouth daily.    [provider]  pseudoephedrine (SUDAFED) 30 MG tablet Take 10 mg by mouth every 4 (four) hours as needed for congestion.    [provider]    Allergies    Adhesive [tape] and Erythromycin  Review of Systems   Review of Systems  Constitutional:  Negative for  chills and fever.  Gastrointestinal:  Positive for abdominal pain, diarrhea and nausea. Negative for constipation and vomiting.  All other systems reviewed and are negative.  Physical Exam Updated Vital Signs BP 130/88   Pulse 66   Temp 98.1 F (36.7 C)   Resp 18   Ht 5\' 9"  (1.753 m)   Wt 93 kg   SpO2 97%   BMI 30.27 kg/m   Physical Exam Vitals and nursing note reviewed.  Constitutional:      Appearance: He is not ill-appearing or diaphoretic.  HENT:     Head: Normocephalic and atraumatic.  Eyes:     Conjunctiva/sclera: Conjunctivae normal.  Cardiovascular:     Rate and Rhythm: Normal rate and regular rhythm.     Heart sounds: Normal heart sounds.  Pulmonary:     Effort: Pulmonary  effort is normal.     Breath sounds: Normal breath sounds. No wheezing, rhonchi or rales.  Abdominal:     Palpations: Abdomen is soft.     Tenderness: There is abdominal tenderness in the left lower quadrant. There is no guarding or rebound.  Musculoskeletal:     Cervical back: Neck supple.  Skin:    General: Skin is warm and dry.  Neurological:     Mental Status: He is alert.    ED Results / Procedures / Treatments   Labs (all labs ordered are listed, but only abnormal results are displayed) Labs Reviewed  COMPREHENSIVE METABOLIC PANEL - Abnormal; Notable for the following components:      Result Value   Potassium 3.4 (*)    All other components within normal limits  CBC - Abnormal; Notable for the following components:   Platelets 142 (*)    All other components within normal limits  URINALYSIS, ROUTINE W REFLEX MICROSCOPIC - Abnormal; Notable for the following components:   Specific Gravity, Urine 1.032 (*)    Protein, ur 30 (*)    All other components within normal limits  LIPASE, BLOOD    EKG None  Radiology CT Abdomen Pelvis W Contrast  Result Date: 04/01/2021 CLINICAL DATA:  Left lower quadrant pain EXAM: CT ABDOMEN AND PELVIS WITH CONTRAST TECHNIQUE: Multidetector CT imaging of the abdomen and pelvis was performed using the standard protocol following bolus administration of intravenous contrast. CONTRAST:  53mL OMNIPAQUE IOHEXOL 300 MG/ML  SOLN COMPARISON:  None. FINDINGS: Lower chest: Lung bases demonstrate no acute consolidation or effusion. Normal cardiac size. Hepatobiliary: No focal liver abnormality is seen. Status post cholecystectomy. No biliary dilatation. Pancreas: Unremarkable. No pancreatic ductal dilatation or surrounding inflammatory changes. Spleen: Spleen slightly enlarged with craniocaudad measurement of 15 cm. Adrenals/Urinary Tract: Adrenal glands are within normal limits. Kidneys show no hydronephrosis. The bladder is unremarkable Stomach/Bowel: The  stomach is nonenlarged. No dilated small bowel. No acute bowel wall thickening. Negative appendix. Postsurgical changes at the rectosigmoid colon Vascular/Lymphatic: Mild aortic atherosclerosis. No aneurysm. No suspicious nodes Reproductive: Prostate slightly enlarged Other: Negative for pelvic effusion or free air. Evidence of prior hernia repair. Musculoskeletal: Post fusion changes at L5-S1 IMPRESSION: 1. No CT evidence for acute intra-abdominal or pelvic abnormality. 2. The spleen appears slightly enlarged. 3. Enlarged prostate Electronically Signed   By: 91m M.D.   On: 04/01/2021 17:50    Procedures Procedures   Medications Ordered in ED Medications  morphine 4 MG/ML injection 4 mg (4 mg Intravenous Given 04/01/21 1410)  iohexol (OMNIPAQUE) 300 MG/ML solution 80 mL (80 mLs Intravenous Contrast Given 04/01/21 1706)  ED Course  I have reviewed the triage vital signs and the nursing notes.  Pertinent labs & imaging results that were available during my care of the patient were reviewed by me and considered in my medical decision making (see chart for details).    MDM Rules/Calculators/A&P                           55 year old male presents to the ED today with complaint of left lower quadrant abdominal pain for the past 4 to 5 days with associated nausea and diarrhea.  History of diverticulitis and bowel resection several years ago.  On arrival to the ED vitals are stable and patient appears to be in no acute distress.  He did go to urgent care today sent him here for further eval.  He is noted to have left lower quadrant abdominal tenderness palpation without rebound or guarding.  He will need a CT scan for further evaluation.  Blood work has been collected, currently pending.  We will plan to treat pain and reevaluate.   CBC without leukocytosis. Hgb stable at 15.2 CMP with potassium 3.4; no other electrolyte abnormalities Lipase 16 U/A without signs of  infection  CT: IMPRESSION:  1. No CT evidence for acute intra-abdominal or pelvic abnormality.  2. The spleen appears slightly enlarged.  3. Enlarged prostate   No acute findings on CT scan. Pt reports he has been told he has an enlarged prostate in the past. He reports sometimes he will get similar episodes of this pain that will typically go away on their own. He does admit anxiety related to previous bowel resection and colostomy and likes to get checked out if the pain persists for too long. He will follow up with his PCP for further eval as he does not currently have a GI doctor. He is stable for discharge.   This note was prepared using Dragon voice recognition software and may include unintentional dictation errors due to the inherent limitations of voice recognition software.  Final Clinical Impression(s) / ED Diagnoses Final diagnoses:  Left lower quadrant abdominal pain    Rx / DC Orders ED Discharge Orders     None        Discharge Instructions      Your workup was overall reassuring in the ED today  Please follow up with your PCP regarding ED visit and for further evaluation of your pain  Return to the ED for any new/worsening symptoms       Tanda Rockers, PA-C 04/01/21 1809    Ernie Avena, MD 04/01/21 1910

## 2021-04-01 NOTE — ED Triage Notes (Addendum)
Lower abd pain with some nausea since Sunday, goes into back and groin more in rt side, pt has history of IBS, Bowel resections,. Denies problems with B&B. Sent from [facility]

## 2021-04-01 NOTE — Discharge Instructions (Addendum)
Your workup was overall reassuring in the ED today  Please follow up with your PCP regarding ED visit and for further evaluation of your pain  Return to the ED for any new/worsening symptoms

## 2021-04-01 NOTE — ED Notes (Signed)
Pt from home with lower abdominal pain that radiates to back since Sunday. Pt has hx of diverticulitis. Pt states he also had some diarrhea since it started but that has resolved. Pt describes pain as throbbing and intermittently. Pt alert & oriented, nad noted.

## 2021-04-01 NOTE — ED Notes (Signed)
Collected green and lavender tubes and sent to lab

## 2021-04-08 DIAGNOSIS — E876 Hypokalemia: Secondary | ICD-10-CM | POA: Diagnosis not present

## 2021-04-08 DIAGNOSIS — K580 Irritable bowel syndrome with diarrhea: Secondary | ICD-10-CM | POA: Diagnosis not present

## 2021-04-08 DIAGNOSIS — Z86010 Personal history of colonic polyps: Secondary | ICD-10-CM | POA: Diagnosis not present

## 2021-04-08 DIAGNOSIS — D696 Thrombocytopenia, unspecified: Secondary | ICD-10-CM | POA: Diagnosis not present

## 2021-04-17 DIAGNOSIS — D120 Benign neoplasm of cecum: Secondary | ICD-10-CM | POA: Diagnosis not present

## 2021-04-17 DIAGNOSIS — D122 Benign neoplasm of ascending colon: Secondary | ICD-10-CM | POA: Diagnosis not present

## 2021-04-17 DIAGNOSIS — K5730 Diverticulosis of large intestine without perforation or abscess without bleeding: Secondary | ICD-10-CM | POA: Diagnosis not present

## 2021-04-17 DIAGNOSIS — Z86010 Personal history of colonic polyps: Secondary | ICD-10-CM | POA: Diagnosis not present

## 2021-04-17 DIAGNOSIS — K648 Other hemorrhoids: Secondary | ICD-10-CM | POA: Diagnosis not present

## 2021-04-19 IMAGING — CR DG CHEST 2V
2 series · 2 of 2 positions shown · non-contrast
Comparison: Radiograph 06/08/2017

CLINICAL DATA: Chest pain, lightheadedness

EXAM:
CHEST - 2 VIEW

[w chest pa]
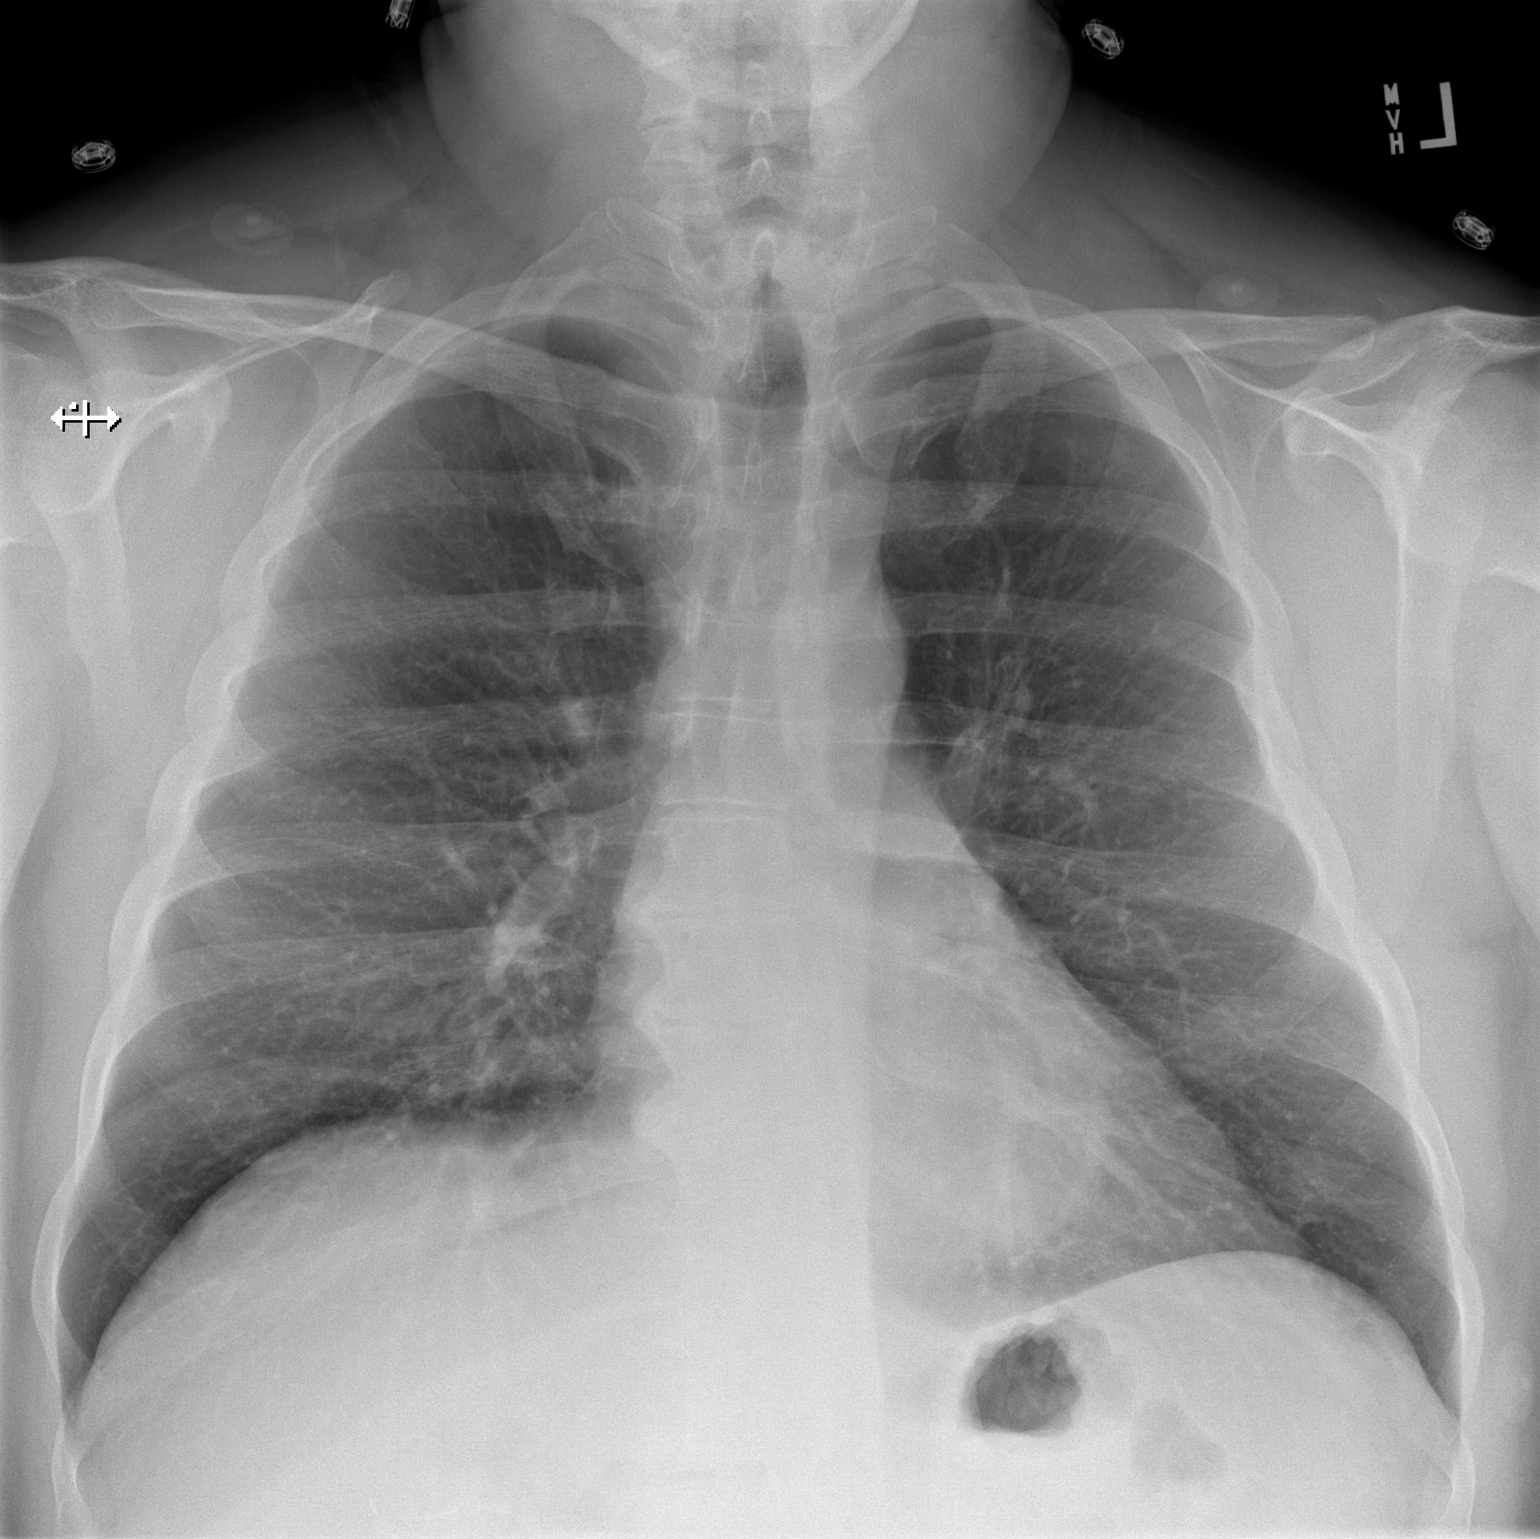

[w chest lat]
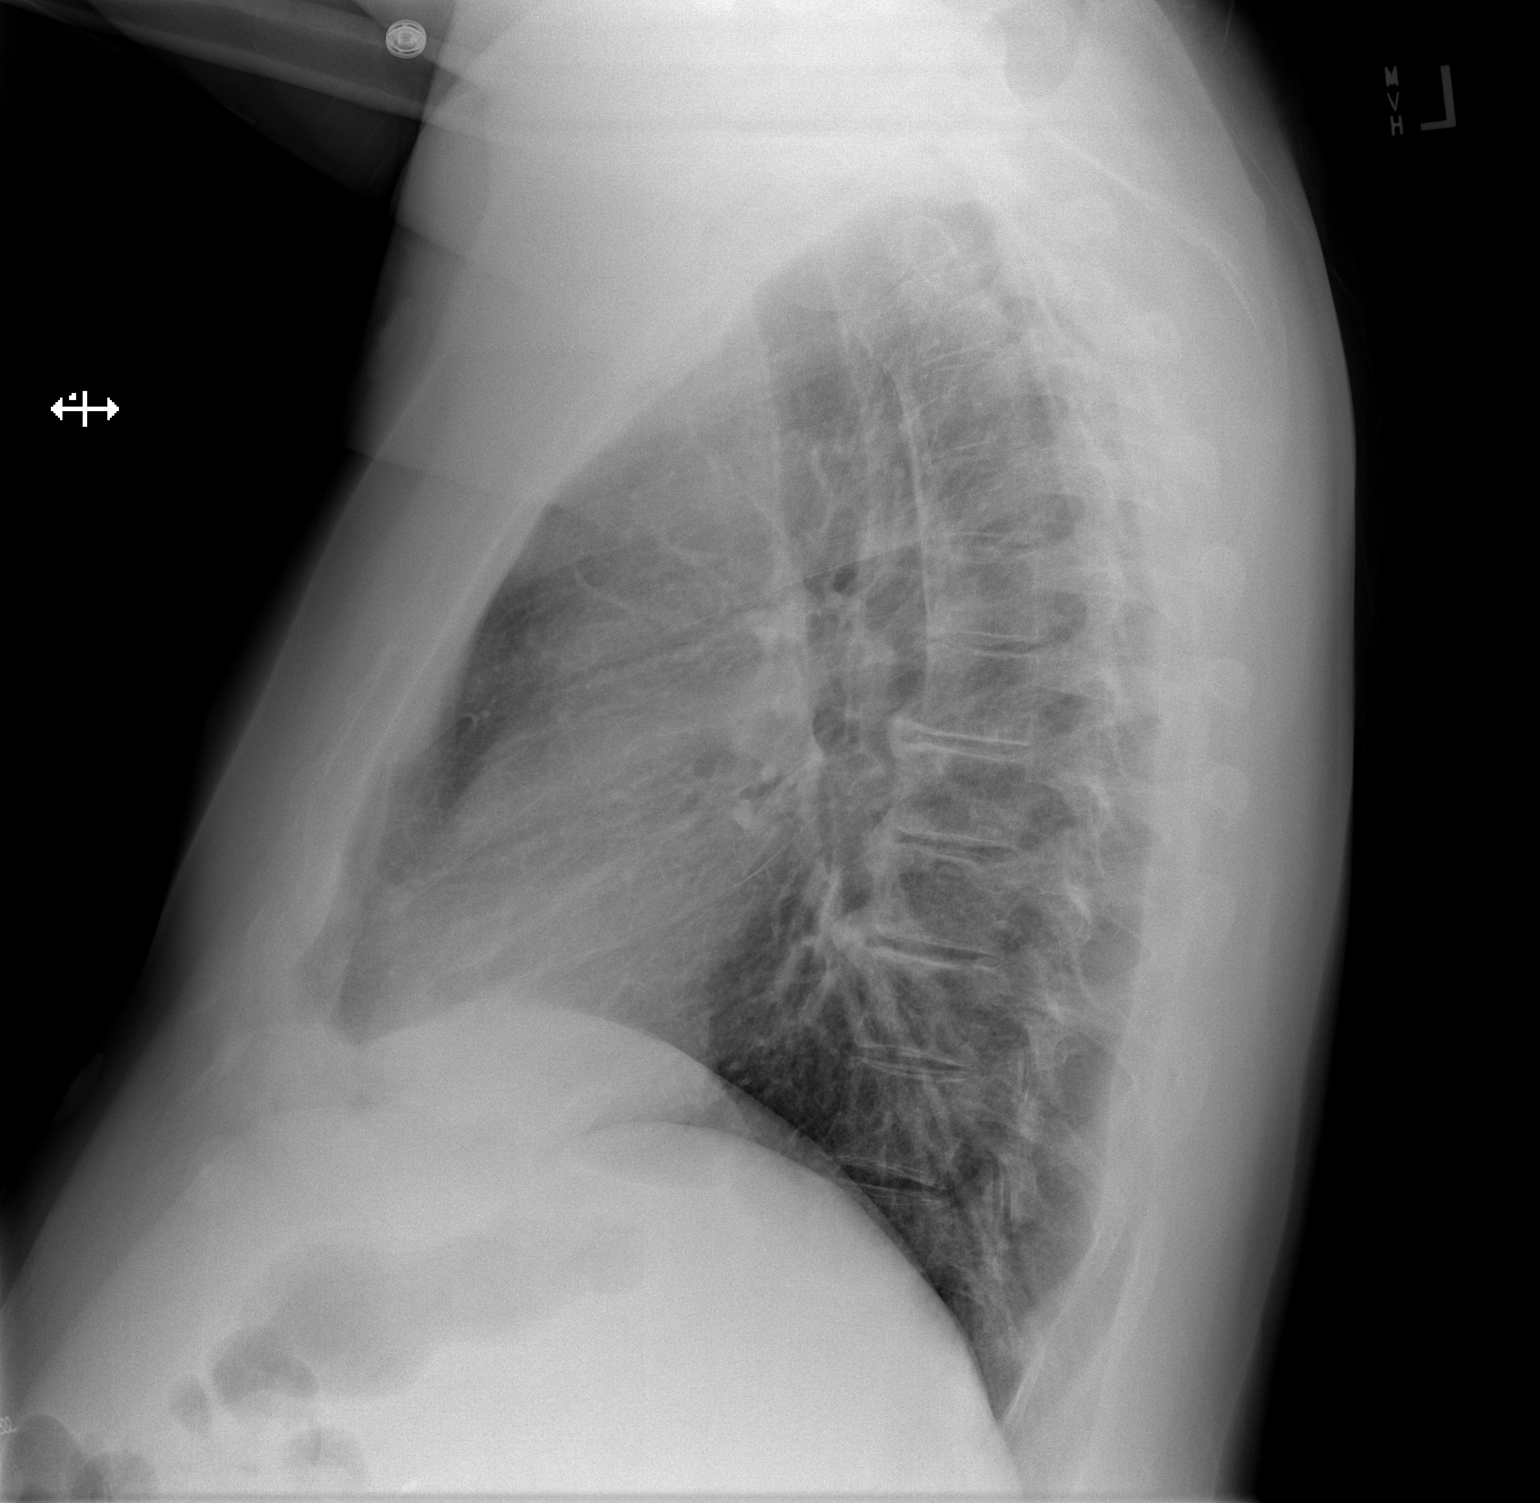

[2 of 2 positions shown; findings below may reference images not displayed]

FINDINGS: No consolidation, features of edema, pneumothorax, or effusion.
Pulmonary vascularity is normally distributed. The cardiomediastinal
contours are unremarkable. No acute osseous or soft tissue
abnormality. Degenerative changes are present in the imaged spine
and shoulders. Likely remote left distal clavicular resection,
appearance unchanged from priors.
IMPRESSION: No acute cardiopulmonary abnormality.

## 2021-05-10 DIAGNOSIS — U071 COVID-19: Secondary | ICD-10-CM | POA: Diagnosis not present

## 2022-01-30 IMAGING — CR DG CHEST 2V
2 series · 2 of 2 positions shown · non-contrast
Comparison: 11/08/2019

CLINICAL DATA: Near syncope and chest pain

EXAM:
CHEST - 2 VIEW

[w chest pa]
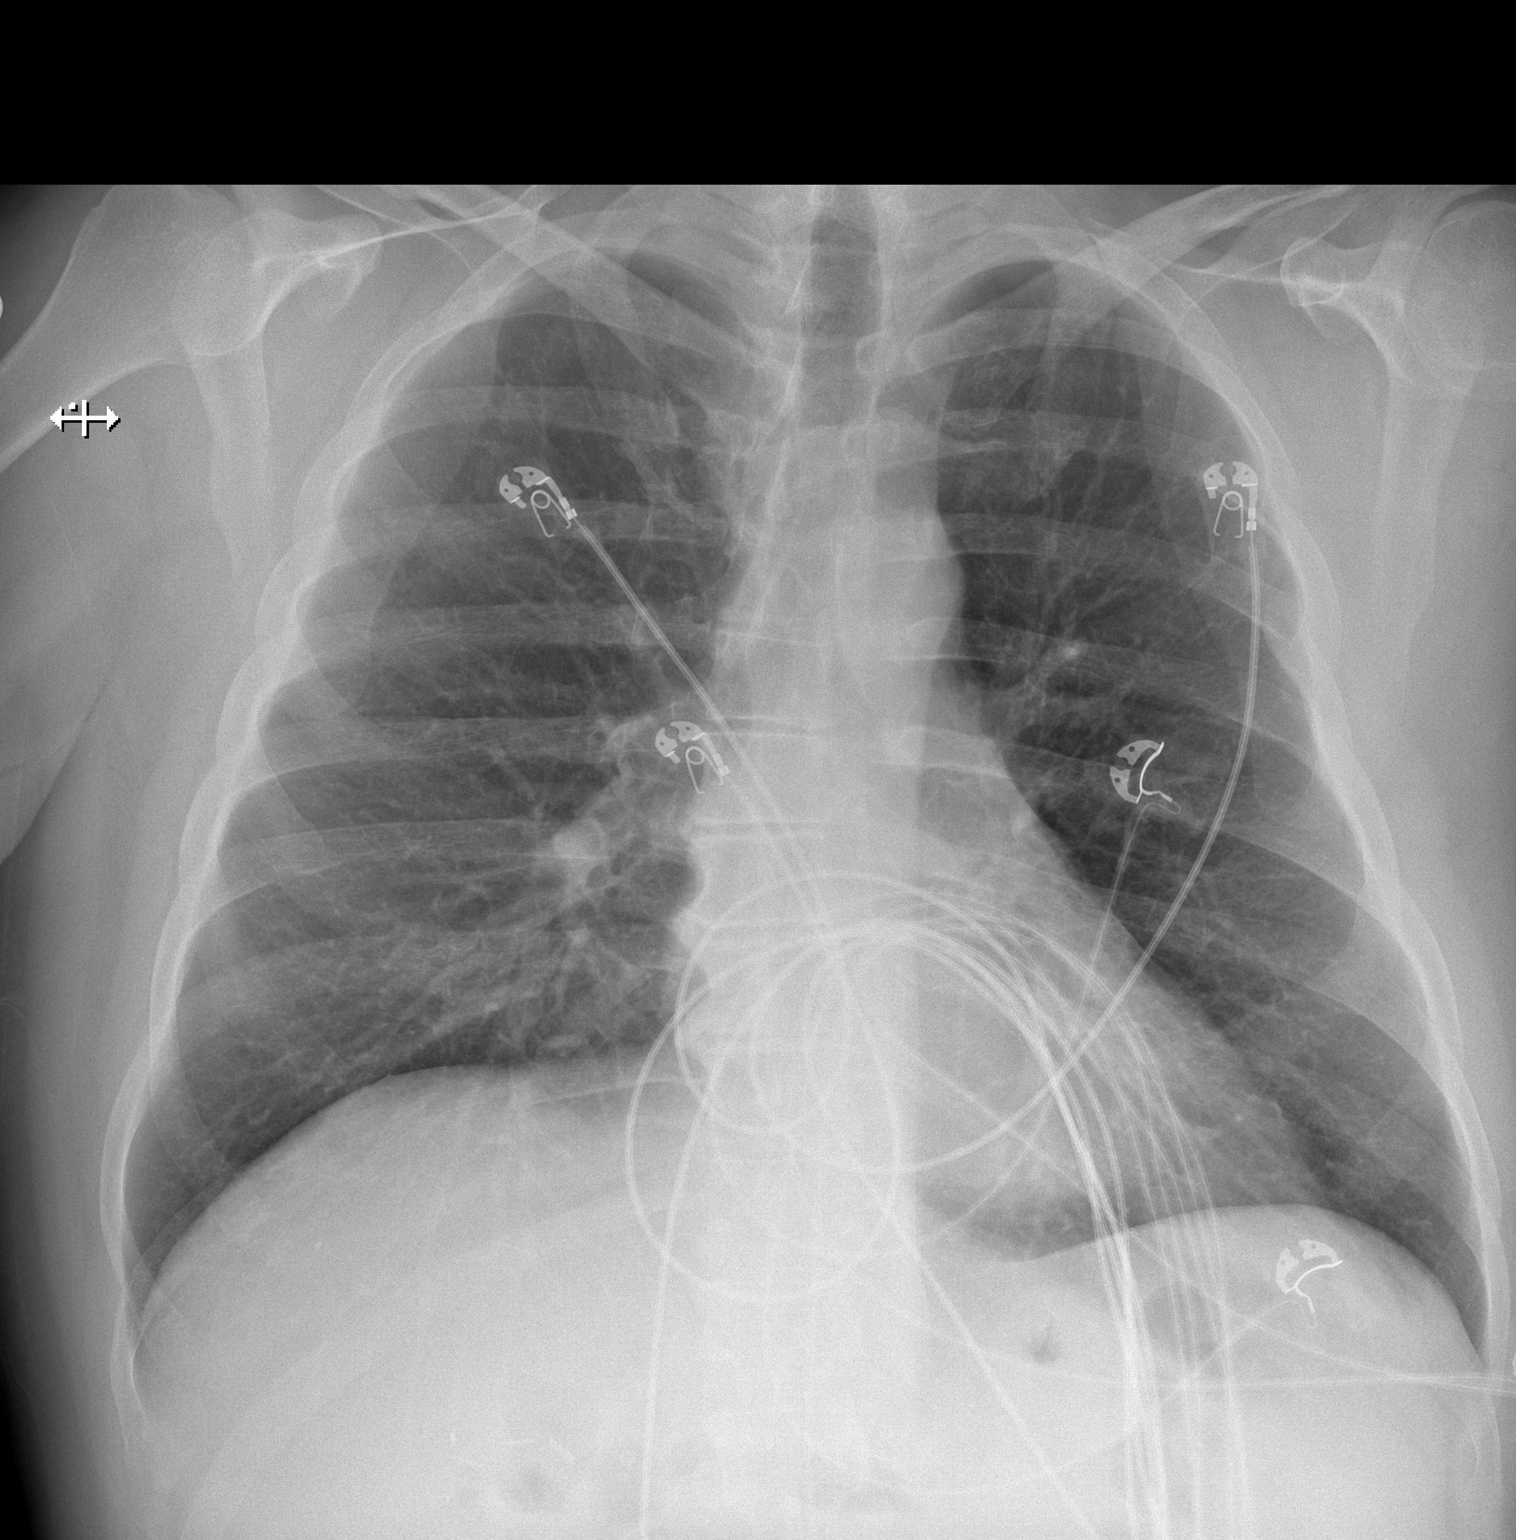

[w chest lat]
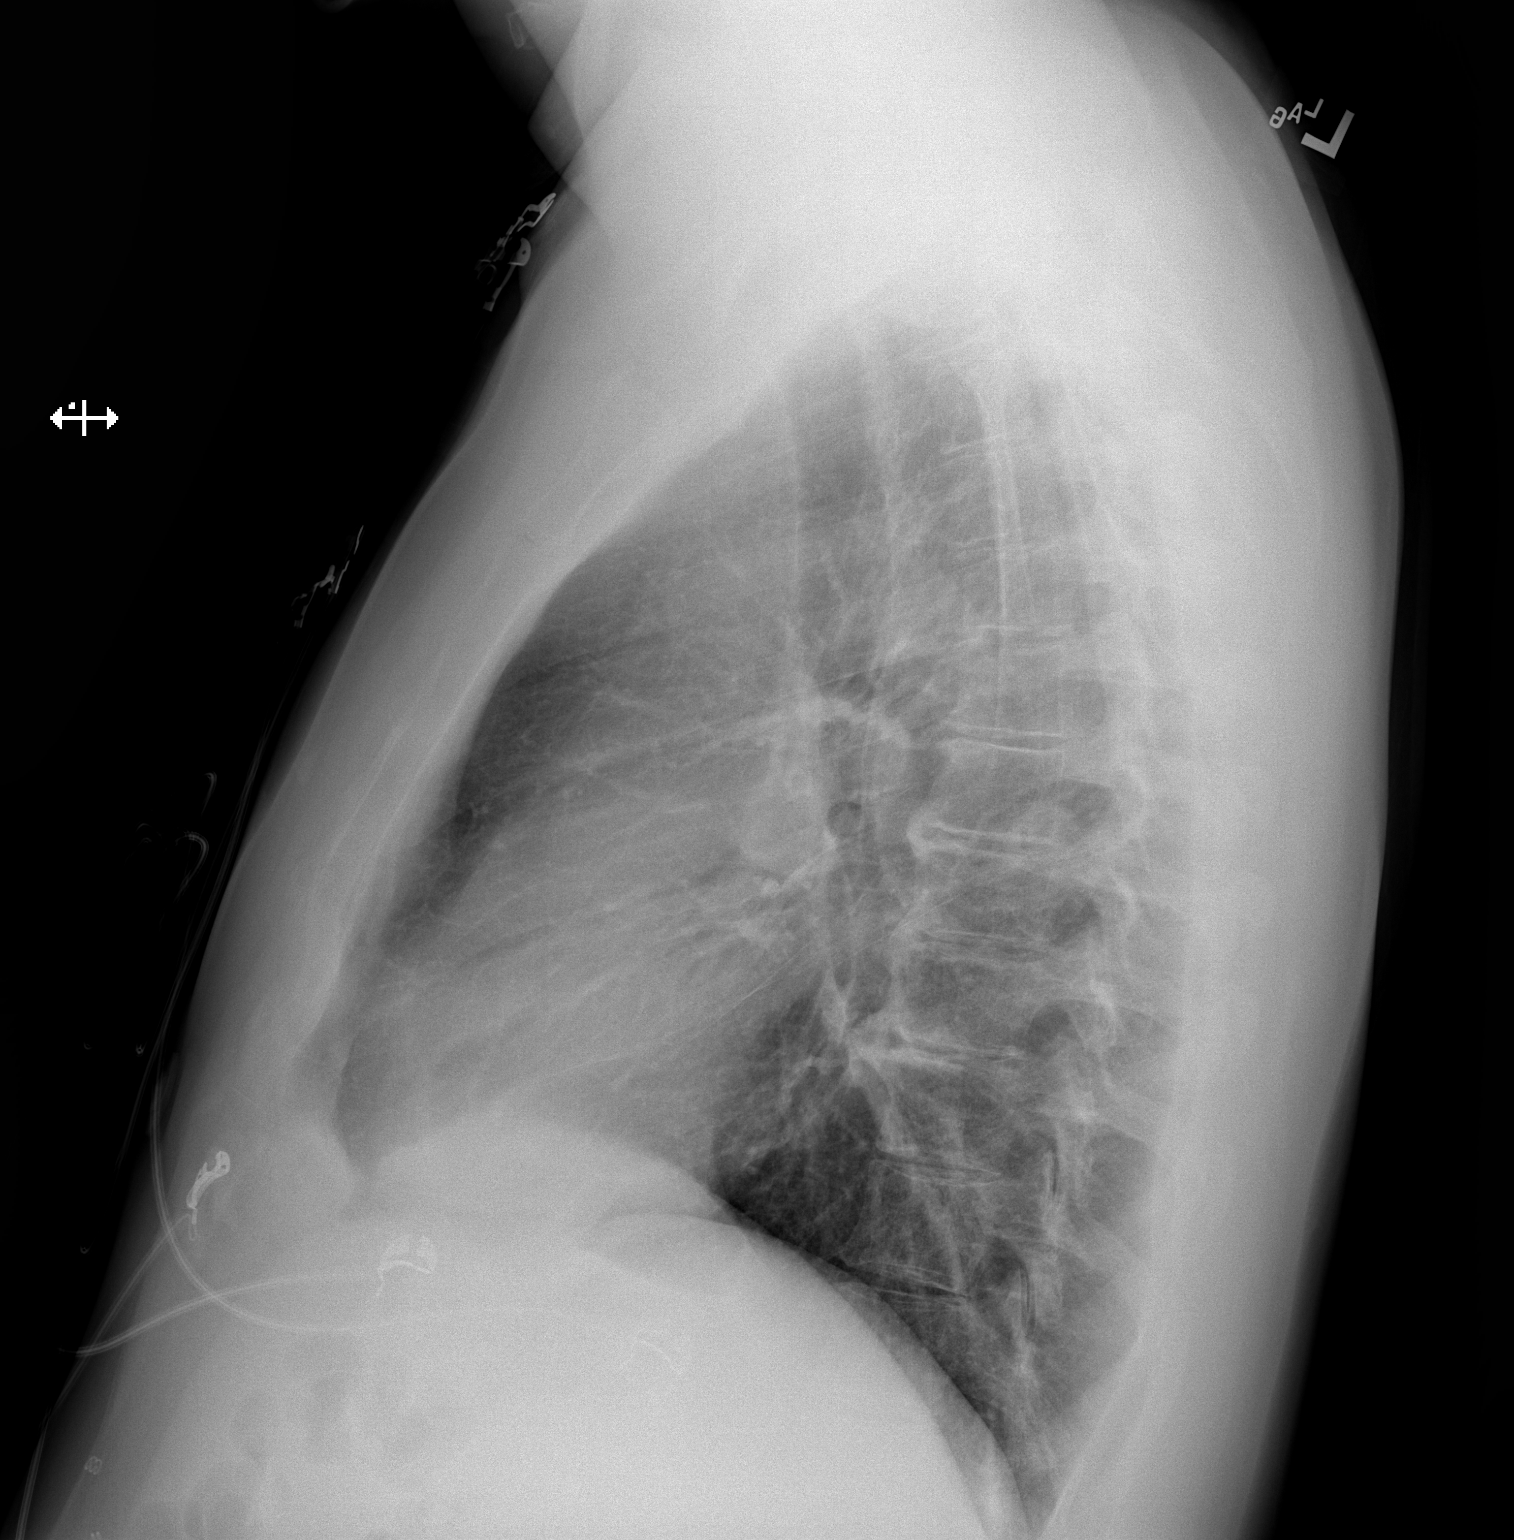

[2 of 2 positions shown; findings below may reference images not displayed]

FINDINGS: The heart size and mediastinal contours are within normal limits.
Both lungs are clear. The visualized skeletal structures are
unremarkable.
IMPRESSION: No active cardiopulmonary disease.

## 2022-03-12 DIAGNOSIS — R300 Dysuria: Secondary | ICD-10-CM | POA: Diagnosis not present

## 2022-03-12 DIAGNOSIS — R1030 Lower abdominal pain, unspecified: Secondary | ICD-10-CM | POA: Diagnosis not present

## 2022-03-12 DIAGNOSIS — N400 Enlarged prostate without lower urinary tract symptoms: Secondary | ICD-10-CM | POA: Diagnosis not present

## 2022-04-01 DIAGNOSIS — R3912 Poor urinary stream: Secondary | ICD-10-CM | POA: Diagnosis not present

## 2022-04-01 DIAGNOSIS — R310 Gross hematuria: Secondary | ICD-10-CM | POA: Diagnosis not present

## 2022-04-01 DIAGNOSIS — N401 Enlarged prostate with lower urinary tract symptoms: Secondary | ICD-10-CM | POA: Diagnosis not present

## 2022-06-04 DIAGNOSIS — N400 Enlarged prostate without lower urinary tract symptoms: Secondary | ICD-10-CM | POA: Diagnosis not present

## 2022-06-04 DIAGNOSIS — Z23 Encounter for immunization: Secondary | ICD-10-CM | POA: Diagnosis not present

## 2022-06-04 DIAGNOSIS — R339 Retention of urine, unspecified: Secondary | ICD-10-CM | POA: Diagnosis not present

## 2022-06-25 DIAGNOSIS — H9041 Sensorineural hearing loss, unilateral, right ear, with unrestricted hearing on the contralateral side: Secondary | ICD-10-CM | POA: Diagnosis not present

## 2022-07-23 DIAGNOSIS — J069 Acute upper respiratory infection, unspecified: Secondary | ICD-10-CM | POA: Diagnosis not present

## 2022-07-23 DIAGNOSIS — R0789 Other chest pain: Secondary | ICD-10-CM | POA: Diagnosis not present

## 2022-07-23 DIAGNOSIS — R072 Precordial pain: Secondary | ICD-10-CM | POA: Diagnosis not present

## 2022-07-23 DIAGNOSIS — B9789 Other viral agents as the cause of diseases classified elsewhere: Secondary | ICD-10-CM | POA: Diagnosis not present

## 2022-07-23 DIAGNOSIS — R42 Dizziness and giddiness: Secondary | ICD-10-CM | POA: Diagnosis not present

## 2022-07-23 DIAGNOSIS — Z1152 Encounter for screening for COVID-19: Secondary | ICD-10-CM | POA: Diagnosis not present

## 2022-07-23 DIAGNOSIS — R0602 Shortness of breath: Secondary | ICD-10-CM | POA: Diagnosis not present

## 2022-07-23 DIAGNOSIS — R059 Cough, unspecified: Secondary | ICD-10-CM | POA: Diagnosis not present

## 2022-10-14 DIAGNOSIS — H2513 Age-related nuclear cataract, bilateral: Secondary | ICD-10-CM | POA: Diagnosis not present

## 2022-12-23 DIAGNOSIS — H25813 Combined forms of age-related cataract, bilateral: Secondary | ICD-10-CM | POA: Diagnosis not present

## 2022-12-23 DIAGNOSIS — H35371 Puckering of macula, right eye: Secondary | ICD-10-CM | POA: Diagnosis not present

## 2023-01-04 DIAGNOSIS — H25813 Combined forms of age-related cataract, bilateral: Secondary | ICD-10-CM | POA: Diagnosis not present

## 2023-01-12 DIAGNOSIS — G4730 Sleep apnea, unspecified: Secondary | ICD-10-CM | POA: Diagnosis not present

## 2023-01-12 DIAGNOSIS — I10 Essential (primary) hypertension: Secondary | ICD-10-CM | POA: Diagnosis not present

## 2023-01-12 DIAGNOSIS — K219 Gastro-esophageal reflux disease without esophagitis: Secondary | ICD-10-CM | POA: Diagnosis not present

## 2023-01-12 DIAGNOSIS — N400 Enlarged prostate without lower urinary tract symptoms: Secondary | ICD-10-CM | POA: Diagnosis not present

## 2023-01-12 DIAGNOSIS — Z79899 Other long term (current) drug therapy: Secondary | ICD-10-CM | POA: Diagnosis not present

## 2023-01-12 DIAGNOSIS — Z6833 Body mass index (BMI) 33.0-33.9, adult: Secondary | ICD-10-CM | POA: Diagnosis not present

## 2023-01-12 DIAGNOSIS — H25813 Combined forms of age-related cataract, bilateral: Secondary | ICD-10-CM | POA: Diagnosis not present

## 2023-01-12 DIAGNOSIS — E669 Obesity, unspecified: Secondary | ICD-10-CM | POA: Diagnosis not present

## 2023-01-12 DIAGNOSIS — H25811 Combined forms of age-related cataract, right eye: Secondary | ICD-10-CM | POA: Diagnosis not present

## 2023-01-19 DIAGNOSIS — H35371 Puckering of macula, right eye: Secondary | ICD-10-CM | POA: Diagnosis not present

## 2023-01-19 DIAGNOSIS — Z881 Allergy status to other antibiotic agents status: Secondary | ICD-10-CM | POA: Diagnosis not present

## 2023-01-19 DIAGNOSIS — H25812 Combined forms of age-related cataract, left eye: Secondary | ICD-10-CM | POA: Diagnosis not present

## 2023-07-30 DIAGNOSIS — N401 Enlarged prostate with lower urinary tract symptoms: Secondary | ICD-10-CM | POA: Diagnosis not present

## 2023-07-30 DIAGNOSIS — R3912 Poor urinary stream: Secondary | ICD-10-CM | POA: Diagnosis not present

## 2023-08-31 DIAGNOSIS — R3912 Poor urinary stream: Secondary | ICD-10-CM | POA: Diagnosis not present

## 2023-08-31 DIAGNOSIS — N401 Enlarged prostate with lower urinary tract symptoms: Secondary | ICD-10-CM | POA: Diagnosis not present

## 2023-09-09 DIAGNOSIS — R3912 Poor urinary stream: Secondary | ICD-10-CM | POA: Diagnosis not present

## 2023-09-09 DIAGNOSIS — N401 Enlarged prostate with lower urinary tract symptoms: Secondary | ICD-10-CM | POA: Diagnosis not present

## 2023-09-23 ENCOUNTER — Other Ambulatory Visit: Payer: Self-pay | Admitting: Urology

## 2023-10-22 NOTE — Progress Notes (Addendum)
 Anesthesia Review:  PCP: Dema Filler  Cardiologist : none   PPM/ ICD: Device Orders: Rep Notified:  Chest x-ray : EKG : 10/26/23  Echo : Stress test: 2018  Cardiac Cath :   Activity level: can do a flght of stairs without difficutly  Sleep Study/ CPAP : none  Fasting Blood Sugar :      / Checks Blood Sugar -- times a day:    Blood Thinner/ Instructions /Last Dose: ASA / Instructions/ Last Dose :    PT came in for preop on 10/26/23.  Initial blood pressure readings were as follows: 160/100, 146/98 and 158/112.  Blood pressure checked in bilateral arms. PT denies any chest pain, shortness of breath, dizziness, headache or blurred vision.  Upon recheck of blood pressures at 0910am- blood pressure in left arm was 150/102 and in right arm was 154/103.  Pulse 75.  PT given copy of blood pressure readings and instructed to notify PCP and address.  EKG done.  - SR. Mahalia Schultze made aware of above.  No new orders given.  PT aware if blood pressure is elevated am of surgery, surgery may be cancelled.

## 2023-10-22 NOTE — Patient Instructions (Signed)
 SURGICAL WAITING ROOM VISITATION  Patients having surgery or a procedure may have no more than 2 support people in the waiting area - these visitors may rotate.    Children under the age of 16 must have an adult with them who is not the patient.  Due to an increase in RSV and influenza rates and associated hospitalizations, children ages 62 and under may not visit patients in [facility] hospitals.  Visitors with respiratory illnesses are discouraged from visiting and should remain at home.  If the patient needs to stay at the hospital during part of their recovery, the visitor guidelines for inpatient rooms apply. Pre-op nurse will coordinate an appropriate time for 1 support person to accompany patient in pre-op.  This support person may not rotate.    Please refer to the [facility] website for the visitor guidelines for Inpatients (after your surgery is over and you are in a regular room).       Your procedure is scheduled on:  11/02/2023    Report to [facility] Main Entrance    Report to admitting at   [phone]   Call this number if you have problems the morning of surgery [phone]   Do not eat food  or drink liquids :After                If you have questions, please contact your surgeon's office.       Oral Hygiene is also important to reduce your risk of infection.                                    Remember - BRUSH YOUR TEETH THE MORNING OF SURGERY WITH YOUR REGULAR TOOTHPASTE  DENTURES WILL BE REMOVED PRIOR TO SURGERY PLEASE DO NOT APPLY "Poly grip" OR ADHESIVES!!!   Do NOT smoke after Midnight   Stop all vitamins and herbal supplements 7 days before surgery.   Take these medicines the morning of surgery with A SIP OF WATER:  proscar, prilosec, flomax   DO NOT TAKE ANY ORAL DIABETIC MEDICATIONS DAY OF YOUR SURGERY  Bring CPAP mask and tubing day of surgery.                              You may not have any metal on your body including hair pins,  jewelry, and body piercing             Do not wear make-up, lotions, powders, perfumes/cologne, or deodorant  Do not wear nail polish including gel and S&S, artificial/acrylic nails, or any other type of covering on natural nails including finger and toenails. If you have artificial nails, gel coating, etc. that needs to be removed by a nail salon please have this removed prior to surgery or surgery may need to be canceled/ delayed if the surgeon/ anesthesia feels like they are unable to be safely monitored.   Do not shave  48 hours prior to surgery.               Men may shave face and neck.   Do not bring valuables to the hospital. [facility] IS NOT             RESPONSIBLE   FOR VALUABLES.   Contacts, glasses, dentures or bridgework may not be worn into surgery.   Bring small overnight bag day  of surgery.   DO NOT BRING YOUR HOME MEDICATIONS TO THE HOSPITAL. PHARMACY WILL DISPENSE MEDICATIONS LISTED ON YOUR MEDICATION LIST TO YOU DURING YOUR ADMISSION IN THE HOSPITAL!    Patients discharged on the day of surgery will not be allowed to drive home.  Someone NEEDS to stay with you for the first 24 hours after anesthesia.   Special Instructions: Bring a copy of your healthcare power of attorney and living will documents the day of surgery if you haven't scanned them before.              Please read over the following fact sheets you were given: IF YOU HAVE QUESTIONS ABOUT YOUR PRE-OP INSTRUCTIONS PLEASE CALL [phone]   If you received a COVID test during your pre-op visit  it is requested that you wear a mask when out in public, stay away from anyone that may not be feeling well and notify your surgeon if you develop symptoms. If you test positive for Covid or have been in contact with anyone that has tested positive in the last 10 days please notify you surgeon.    [facility] - Preparing for Surgery Before surgery, you can play an important role.  Because skin is not sterile, your  skin needs to be as free of germs as possible.  You can reduce the number of germs on your skin by washing with CHG (chlorahexidine gluconate) soap before surgery.  CHG is an antiseptic cleaner which kills germs and bonds with the skin to continue killing germs even after washing. Please DO NOT use if you have an allergy to CHG or antibacterial soaps.  If your skin becomes reddened/irritated stop using the CHG and inform your nurse when you arrive at Short Stay. Do not shave (including legs and underarms) for at least 48 hours prior to the first CHG shower.  You may shave your face/neck. Please follow these instructions carefully:  1.  Shower with CHG Soap the night before surgery and the  morning of Surgery.  2.  If you choose to wash your hair, wash your hair first as usual with your  normal  shampoo.  3.  After you shampoo, rinse your hair and body thoroughly to remove the  shampoo.                           4.  Use CHG as you would any other liquid soap.  You can apply chg directly  to the skin and wash                       Gently with a scrungie or clean washcloth.  5.  Apply the CHG Soap to your body ONLY FROM THE NECK DOWN.   Do not use on face/ open                           Wound or open sores. Avoid contact with eyes, ears mouth and genitals (private parts).                       Wash face,  Genitals (private parts) with your normal soap.             6.  Wash thoroughly, paying special attention to the area where your surgery  will be performed.  7.  Thoroughly rinse your body with warm water from the  neck down.  8.  DO NOT shower/wash with your normal soap after using and rinsing off  the CHG Soap.                9.  Pat yourself dry with a clean towel.            10.  Wear clean pajamas.            11.  Place clean sheets on your bed the night of your first shower and do not  sleep with pets. Day of Surgery : Do not apply any lotions/deodorants the morning of surgery.  Please wear  clean clothes to the hospital/surgery center.  FAILURE TO FOLLOW THESE INSTRUCTIONS MAY RESULT IN THE CANCELLATION OF YOUR SURGERY PATIENT SIGNATURE_________________________________  NURSE SIGNATURE__________________________________  ________________________________________________________________________

## 2023-10-26 ENCOUNTER — Other Ambulatory Visit: Payer: Self-pay

## 2023-10-26 ENCOUNTER — Encounter (HOSPITAL_COMMUNITY)
Admission: RE | Admit: 2023-10-26 | Discharge: 2023-10-26 | Disposition: A | Discharge status: Home / Self Care | Source: Ambulatory Visit | Attending: Urology | Admitting: Urology

## 2023-10-26 ENCOUNTER — Encounter (HOSPITAL_COMMUNITY): Payer: Self-pay

## 2023-10-26 VITALS — HR 75 | Temp 97.8°F | Resp 16 | Ht 69.0 in | Wt 220.0 lb

## 2023-10-26 DIAGNOSIS — Z01818 Encounter for other preprocedural examination: Secondary | ICD-10-CM | POA: Insufficient documentation

## 2023-10-26 HISTORY — DX: Personal history of urinary calculi: Z87.442

## 2023-10-26 LAB — BASIC METABOLIC PANEL WITH GFR
Anion gap: 10 (ref 5–15)
BUN: 14 mg/dL (ref 6–20)
CO2: 23 mmol/L (ref 22–32)
Calcium: 9.1 mg/dL (ref 8.9–10.3)
Chloride: 101 mmol/L (ref 98–111)
Creatinine, Ser: 0.94 mg/dL (ref 0.61–1.24)
GFR, Estimated: >60 mL/min (ref >60)
Glucose, Bld: 112 mg/dL — ABNORMAL HIGH (ref 70–99)
Potassium: 3.7 mmol/L (ref 3.5–5.1)
Sodium: 134 mmol/L — ABNORMAL LOW (ref 135–145)

## 2023-10-26 LAB — CBC
HCT: 46.5 % (ref 39.0–52.0)
Hemoglobin: 14.7 g/dL (ref 13.0–17.0)
MCH: 26.7 pg (ref 26.0–34.0)
MCHC: 31.6 g/dL (ref 30.0–36.0)
MCV: 84.5 fL (ref 80.0–100.0)
Platelets: 196 10*3/uL (ref 150–400)
RBC: 5.5 MIL/uL (ref 4.22–5.81)
RDW: 14 % (ref 11.5–15.5)
WBC: 5 10*3/uL (ref 4.0–10.5)
nRBC: 0 % (ref 0.0–0.2)

## 2023-10-29 DIAGNOSIS — I10 Essential (primary) hypertension: Secondary | ICD-10-CM | POA: Diagnosis not present

## 2023-11-02 ENCOUNTER — Other Ambulatory Visit: Payer: Self-pay

## 2023-11-02 ENCOUNTER — Ambulatory Visit (HOSPITAL_COMMUNITY)

## 2023-11-02 ENCOUNTER — Encounter (HOSPITAL_COMMUNITY)
Admission: RE | Disposition: A | Discharge status: Home / Self Care | Payer: Self-pay | Source: Home / Self Care | Attending: Urology

## 2023-11-02 ENCOUNTER — Encounter (HOSPITAL_COMMUNITY): Payer: Self-pay | Admitting: Urology

## 2023-11-02 ENCOUNTER — Ambulatory Visit (HOSPITAL_COMMUNITY)
Admission: RE | Admit: 2023-11-02 | Discharge: 2023-11-02 | Disposition: A | Discharge status: Home / Self Care | Attending: Urology | Admitting: Urology

## 2023-11-02 DIAGNOSIS — R350 Frequency of micturition: Secondary | ICD-10-CM | POA: Diagnosis not present

## 2023-11-02 DIAGNOSIS — R351 Nocturia: Secondary | ICD-10-CM | POA: Diagnosis not present

## 2023-11-02 DIAGNOSIS — Z01818 Encounter for other preprocedural examination: Secondary | ICD-10-CM

## 2023-11-02 DIAGNOSIS — R3912 Poor urinary stream: Secondary | ICD-10-CM | POA: Insufficient documentation

## 2023-11-02 DIAGNOSIS — N401 Enlarged prostate with lower urinary tract symptoms: Secondary | ICD-10-CM | POA: Insufficient documentation

## 2023-11-02 DIAGNOSIS — R338 Other retention of urine: Secondary | ICD-10-CM | POA: Diagnosis not present

## 2023-11-02 SURGERY — ABLATION, PROSTATE, TRANSURETHRAL, USING WATERJET
Anesthesia: General | Site: Prostate

## 2023-11-02 MED ORDER — OXYCODONE HCL 5 MG/5ML PO SOLN: 5.0000 mg | Freq: Once | ORAL | Status: DC | PRN

## 2023-11-02 MED ORDER — NAPROXEN SODIUM 220 MG PO TABS: 440.0000 mg | ORAL_TABLET | Freq: Every evening | ORAL | Status: AC

## 2023-11-02 MED ORDER — FENTANYL CITRATE (PF) 100 MCG/2ML IJ SOLN
INTRAMUSCULAR | Status: AC
  Filled 2023-11-02: qty 2

## 2023-11-02 MED ORDER — SODIUM CHLORIDE 0.9 % IV SOLN
2.0000 g | INTRAVENOUS | Status: AC
  Administered 2023-11-02: 2 g via INTRAVENOUS
  Filled 2023-11-02: qty 20

## 2023-11-02 MED ORDER — FENTANYL CITRATE PF 50 MCG/ML IJ SOSY
PREFILLED_SYRINGE | INTRAMUSCULAR | Status: AC
  Filled 2023-11-02: qty 1

## 2023-11-02 MED ORDER — PHENYLEPHRINE HCL-NACL 20-0.9 MG/250ML-% IV SOLN
INTRAVENOUS | Status: DC | PRN
  Administered 2023-11-02: 40 ug/min via INTRAVENOUS

## 2023-11-02 MED ORDER — MIDAZOLAM HCL 5 MG/5ML IJ SOLN
INTRAMUSCULAR | Status: DC | PRN
  Administered 2023-11-02 (×2): 1 mg via INTRAVENOUS

## 2023-11-02 MED ORDER — ONDANSETRON HCL 4 MG/2ML IJ SOLN
INTRAMUSCULAR | Status: AC
  Filled 2023-11-02: qty 2

## 2023-11-02 MED ORDER — ACETAMINOPHEN 10 MG/ML IV SOLN
INTRAVENOUS | Status: AC
  Filled 2023-11-02: qty 100

## 2023-11-02 MED ORDER — LIDOCAINE HCL (CARDIAC) PF 100 MG/5ML IV SOSY
PREFILLED_SYRINGE | INTRAVENOUS | Status: DC | PRN
  Administered 2023-11-02: 60 mg via INTRAVENOUS

## 2023-11-02 MED ORDER — DEXAMETHASONE SODIUM PHOSPHATE 10 MG/ML IJ SOLN
INTRAMUSCULAR | Status: AC
  Filled 2023-11-02: qty 1

## 2023-11-02 MED ORDER — 0.9 % SODIUM CHLORIDE (POUR BTL) OPTIME
TOPICAL | Status: DC | PRN
  Administered 2023-11-02: 1000 mL

## 2023-11-02 MED ORDER — OXYCODONE HCL 5 MG PO TABS: 5.0000 mg | ORAL_TABLET | Freq: Once | ORAL | Status: DC | PRN

## 2023-11-02 MED ORDER — FENTANYL CITRATE (PF) 100 MCG/2ML IJ SOLN
INTRAMUSCULAR | Status: DC | PRN
  Administered 2023-11-02: 100 ug via INTRAVENOUS

## 2023-11-02 MED ORDER — PROPOFOL 1000 MG/100ML IV EMUL
INTRAVENOUS | Status: AC
  Filled 2023-11-02: qty 100

## 2023-11-02 MED ORDER — DEXAMETHASONE SODIUM PHOSPHATE 10 MG/ML IJ SOLN
INTRAMUSCULAR | Status: DC | PRN
  Administered 2023-11-02: 10 mg via INTRAVENOUS

## 2023-11-02 MED ORDER — KETOROLAC TROMETHAMINE 30 MG/ML IJ SOLN
INTRAMUSCULAR | Status: DC
Start: 2023-11-02 — End: 2023-11-02
  Filled 2023-11-02: qty 1

## 2023-11-02 MED ORDER — ROCURONIUM BROMIDE 100 MG/10ML IV SOLN
INTRAVENOUS | Status: DC | PRN
  Administered 2023-11-02: 100 mg via INTRAVENOUS
  Administered 2023-11-02: 20 mg via INTRAVENOUS

## 2023-11-02 MED ORDER — SUGAMMADEX SODIUM 200 MG/2ML IV SOLN
INTRAVENOUS | Status: DC | PRN
Start: 2023-11-02 — End: 2023-11-02
  Administered 2023-11-02: 300 mg via INTRAVENOUS

## 2023-11-02 MED ORDER — PROPOFOL 500 MG/50ML IV EMUL
INTRAVENOUS | Status: DC | PRN
  Administered 2023-11-02: 80 ug/kg/min via INTRAVENOUS

## 2023-11-02 MED ORDER — MIDAZOLAM HCL 2 MG/2ML IJ SOLN
INTRAMUSCULAR | Status: AC
  Filled 2023-11-02: qty 2

## 2023-11-02 MED ORDER — SODIUM CHLORIDE 0.9 % IR SOLN
Status: DC | PRN
Start: 2023-11-02 — End: 2023-11-02
  Administered 2023-11-02: 3000 mL

## 2023-11-02 MED ORDER — KETOROLAC TROMETHAMINE 30 MG/ML IJ SOLN
30.0000 mg | Freq: Once | INTRAMUSCULAR | Status: AC | PRN
  Administered 2023-11-02: 30 mg via INTRAVENOUS

## 2023-11-02 MED ORDER — TRANEXAMIC ACID-NACL 1000-0.7 MG/100ML-% IV SOLN
1000.0000 mg | INTRAVENOUS | Status: AC
  Administered 2023-11-02: 1000 mg via INTRAVENOUS
  Filled 2023-11-02: qty 100

## 2023-11-02 MED ORDER — CHLORHEXIDINE GLUCONATE 0.12 % MT SOLN
15.0000 mL | Freq: Once | OROMUCOSAL | Status: AC
  Administered 2023-11-02: 15 mL via OROMUCOSAL

## 2023-11-02 MED ORDER — ROCURONIUM BROMIDE 10 MG/ML (PF) SYRINGE
PREFILLED_SYRINGE | INTRAVENOUS | Status: AC
  Filled 2023-11-02: qty 10

## 2023-11-02 MED ORDER — ORAL CARE MOUTH RINSE: 15.0000 mL | Freq: Once | OROMUCOSAL | Status: AC

## 2023-11-02 MED ORDER — PHENYLEPHRINE 80 MCG/ML (10ML) SYRINGE FOR IV PUSH (FOR BLOOD PRESSURE SUPPORT)
PREFILLED_SYRINGE | INTRAVENOUS | Status: DC | PRN
  Administered 2023-11-02 (×2): 160 ug via INTRAVENOUS
  Administered 2023-11-02: 120 ug via INTRAVENOUS
  Administered 2023-11-02: 160 ug via INTRAVENOUS

## 2023-11-02 MED ORDER — FENTANYL CITRATE PF 50 MCG/ML IJ SOSY
25.0000 ug | PREFILLED_SYRINGE | INTRAMUSCULAR | Status: DC | PRN
Start: 2023-11-02 — End: 2023-11-02
  Administered 2023-11-02 (×3): 50 ug via INTRAVENOUS

## 2023-11-02 MED ORDER — PROPOFOL 10 MG/ML IV BOLUS
INTRAVENOUS | Status: DC | PRN
  Administered 2023-11-02: 200 mg via INTRAVENOUS

## 2023-11-02 MED ORDER — AMISULPRIDE (ANTIEMETIC) 5 MG/2ML IV SOLN: 10.0000 mg | Freq: Once | INTRAVENOUS | Status: DC | PRN

## 2023-11-02 MED ORDER — PHENYLEPHRINE 80 MCG/ML (10ML) SYRINGE FOR IV PUSH (FOR BLOOD PRESSURE SUPPORT)
PREFILLED_SYRINGE | INTRAVENOUS | Status: AC
  Filled 2023-11-02: qty 10

## 2023-11-02 MED ORDER — LACTATED RINGERS IV SOLN: INTRAVENOUS | Status: DC

## 2023-11-02 MED ORDER — PROPOFOL 10 MG/ML IV BOLUS
INTRAVENOUS | Status: AC
  Filled 2023-11-02: qty 20

## 2023-11-02 MED ORDER — ONDANSETRON HCL 4 MG/2ML IJ SOLN
INTRAMUSCULAR | Status: DC | PRN
  Administered 2023-11-02: 4 mg via INTRAVENOUS

## 2023-11-02 MED ORDER — ACETAMINOPHEN 10 MG/ML IV SOLN
1000.0000 mg | Freq: Once | INTRAVENOUS | Status: DC | PRN
  Administered 2023-11-02: 1000 mg via INTRAVENOUS

## 2023-11-02 SURGICAL SUPPLY — 27 items
BAG URINE DRAIN 2000ML AR STRL (UROLOGICAL SUPPLIES) ×1 IMPLANT
BAND RUBBER #18 3X1/16 STRL (MISCELLANEOUS) IMPLANT
CATH HEMA 3WAY 30CC 22FR COUDE (CATHETERS) IMPLANT
CATH HEMA 3WAY 30CC 24FR COUDE (CATHETERS) IMPLANT
CATH HEMATURIA 20FR (CATHETERS) IMPLANT
COVER MAYO STAND STRL (DRAPES) ×1 IMPLANT
DRAPE FOOT SWITCH (DRAPES) ×1 IMPLANT
DRAPE SURG IRRIG POUCH 19X23 (DRAPES) IMPLANT
GEL ULTRASOUND 8.5O AQUASONIC (MISCELLANEOUS) ×1 IMPLANT
GLOVE SURG LX STRL 7.5 STRW (GLOVE) ×1 IMPLANT
GOWN STRL REUS W/ TWL XL LVL3 (GOWN DISPOSABLE) ×1 IMPLANT
HANDPIECE AQUABEAM (MISCELLANEOUS) ×1 IMPLANT
HOLDER FOLEY CATH W/STRAP (MISCELLANEOUS) IMPLANT
KIT TURNOVER KIT A (KITS) IMPLANT
LOOP CUT BIPOLAR 24F LRG (ELECTROSURGICAL) IMPLANT
MANIFOLD NEPTUNE II (INSTRUMENTS) ×1 IMPLANT
MAT ABSORB FLUID 56X50 GRAY (MISCELLANEOUS) ×1 IMPLANT
PACK CYSTO (CUSTOM PROCEDURE TRAY) ×1 IMPLANT
PACK DRAPE AQUABEAM (MISCELLANEOUS) ×1 IMPLANT
PAD PREP 24X48 CUFFED NSTRL (MISCELLANEOUS) ×1 IMPLANT
PIN SAFETY STERILE (MISCELLANEOUS) IMPLANT
SYR 30ML LL (SYRINGE) ×1 IMPLANT
SYRINGE TOOMEY IRRIG 70ML (MISCELLANEOUS) ×2 IMPLANT
TOWEL OR 17X26 10 PK STRL BLUE (TOWEL DISPOSABLE) ×1 IMPLANT
TUBING CONNECTING 10 (TUBING) ×2 IMPLANT
TUBING UROLOGY SET (TUBING) ×1 IMPLANT
UNDERPAD 30X36 HEAVY ABSORB (UNDERPADS AND DIAPERS) ×1 IMPLANT

## 2023-11-02 NOTE — Anesthesia Postprocedure Evaluation (Signed)
 Anesthesia Post Note  Patient: George Bailey  Procedure(s) Performed: ABLATION, PROSTATE, TRANSURETHRAL, USING WATERJET (Prostate)     Patient location during evaluation: PACU Anesthesia Type: General Level of consciousness: awake Pain management: pain level controlled Vital Signs Assessment: post-procedure vital signs reviewed and stable Respiratory status: spontaneous breathing, nonlabored ventilation and respiratory function stable Cardiovascular status: blood pressure returned to baseline and stable Postop Assessment: no apparent nausea or vomiting Anesthetic complications: no   No notable events documented.  Last Vitals:  Vitals:   11/02/23 1530 11/02/23 1546  BP: (!) 138/100 (!) 138/97  Pulse: 65 70  Resp: 13 18  Temp: 36.4 C 36.6 C  SpO2: 98% 100%    Last Pain:  Vitals:   11/02/23 1546  TempSrc: Oral  PainSc: 3                  Tura Roller P Ameka Krigbaum

## 2023-11-02 NOTE — Anesthesia Preprocedure Evaluation (Addendum)
 Anesthesia Evaluation  Patient identified by MRN, date of birth, ID band Patient awake    Reviewed: Allergy & Precautions, NPO status , Patient's Chart, lab work & pertinent test results  History of Anesthesia Complications (+) PONV and history of anesthetic complications  Airway Mallampati: II  TM Distance: >3 FB Neck ROM: Full    Dental no notable dental hx.    Pulmonary neg pulmonary ROS   Pulmonary exam normal        Cardiovascular Normal cardiovascular exam     Neuro/Psych  PSYCHIATRIC DISORDERS      negative neurological ROS     GI/Hepatic Neg liver ROS,GERD  Medicated and Controlled,,  Endo/Other  negative endocrine ROS    Renal/GU negative Renal ROS     Musculoskeletal negative musculoskeletal ROS (+)    Abdominal  (+) + obese  Peds  Hematology negative hematology ROS (+)   Anesthesia Other Findings BENIGN PROSTATIC HYPERPLASIA  Reproductive/Obstetrics                             Anesthesia Physical Anesthesia Plan  ASA: 2  Anesthesia Plan: General   Post-op Pain Management:    Induction: Intravenous  PONV Risk Score and Plan: 3 and Ondansetron , Dexamethasone, Midazolam  and Treatment may vary due to age or medical condition  Airway Management Planned: Oral ETT  Additional Equipment:   Intra-op Plan:   Post-operative Plan: Extubation in OR  Informed Consent: I have reviewed the patients History and Physical, chart, labs and discussed the procedure including the risks, benefits and alternatives for the proposed anesthesia with the patient or authorized representative who has indicated his/her understanding and acceptance.     Dental advisory given  Plan Discussed with: CRNA  Anesthesia Plan Comments:        Anesthesia Quick Evaluation

## 2023-11-02 NOTE — Transfer of Care (Signed)
 Immediate Anesthesia Transfer of Care Note  Patient: George Bailey  Procedure(s) Performed: ABLATION, PROSTATE, TRANSURETHRAL, USING WATERJET (Prostate)  Patient Location: PACU  Anesthesia Type:General  Level of Consciousness: awake, alert , and oriented  Airway & Oxygen Therapy: Patient Spontanous Breathing and Patient connected to face mask oxygen  Post-op Assessment: Report given to RN and Post -op Vital signs reviewed and stable  Post vital signs: Reviewed and stable  Last Vitals:  Vitals Value Taken Time  BP 117/81 11/02/23 1307  Temp 36.6 C 11/02/23 1307  Pulse 89 11/02/23 1310  Resp 25 11/02/23 1310  SpO2 90 % 11/02/23 1310  Vitals shown include unfiled device data.  Last Pain:  Vitals:   11/02/23 0830  TempSrc: Oral         Complications: No notable events documented.

## 2023-11-02 NOTE — Anesthesia Procedure Notes (Addendum)
 Procedure Name: Intubation Date/Time: 11/02/2023 11:47 AM  Performed by: Elaina Graver, CRNAPre-anesthesia Checklist: Patient identified, Emergency Drugs available, Suction available and Patient being monitored Patient Re-evaluated:Patient Re-evaluated prior to induction Oxygen Delivery Method: Circle System Utilized Preoxygenation: Pre-oxygenation with 100% oxygen Induction Type: IV induction Ventilation: Mask ventilation without difficulty Laryngoscope Size: Glidescope and 4 Grade View: Grade II Tube type: Oral Tube size: 7.5 mm Number of attempts: 1 Airway Equipment and Method: Rigid stylet Placement Confirmation: ETT inserted through vocal cords under direct vision, positive ETCO2 and breath sounds checked- equal and bilateral Secured at: 23 cm Tube secured with: Tape Dental Injury: Teeth and Oropharynx as per pre-operative assessment

## 2023-11-02 NOTE — Op Note (Signed)
 Preoperative diagnosis: BPH with lower urinary tract symptoms, weak stream, frequency  Postoperative diagnosis: Same   Procedure: Robotic water jet ablation of the prostate   Surgeon: Derrick Fling   Anesthesia: General   Indication for procedure:   Findings:  EUA-penis was circumcised without mass or lesion.  The glans and meatus appeared normal.  The meatus was a bit small.  The scrotum appeared normal.  On DRE prostate was about 30 g and smooth without hard area of nodule.  Cystoscopy revealed obstructing lateral lobes with a high bladder neck.  Otherwise bladder had no mucosal lesions, stone or foreign body.  Ureteral orifices were in the normal orthotopic position and they appeared normal pre and post ablation/resection.  Description of procedure:  He was brought to the operating room and placed supine on the operating table.  After adequate anesthesia he was placed lithotomy position. Timeout was performed to confirm the patient and procedure. The TRUS Stepper was mounted to the Articulating Arm and secured to OR bed. The ultrasound probe was attached to the stepper. Exam under anesthesia was performed and the TRUS was inserted per rectum.  There was no resistance. The ultrasound probe was aligned, and confirmation made that the prostate is centered and aligned using both transverse and sagittal views. The bladder neck, verumontanum and the central/transition zones were identified.  Genitalia were prepped and draped in the usual sterile fashion. The 72F AQUABEAM Handpiece is inserted into the prostatic urethra and a complete cystoscopic evaluation was performed by inspecting the prostate, bladder, and identifying the location of the verumontanum/external sphincter. The AQUABEAM Handpiece was secured to the Handpiece Articulating Arm. Confirmed alignment of AQUABEAM Handpiece and TRUS Probe to be parallel and colinear. Confirmation that AQUABEAM nozzle is centered and anterior of the bladder neck or  the median lobe. The cystoscope was then retracted to visualize the verumontanum and external sphincter and the cystoscope tip was positioned just proximal to the external sphincter. Reconfirmed alignment of the TRUS probe with the AQUABEAM Handpiece and compression applied with TRUS probe. Horizontal alignment of the Handpiece waterjet nozzle was performed. The Aquablation treatment zones were planned utilizing real-time TRUS to visualize the contour of the prostate and the depth and radial angles of resection were defined in the transverse view. In the sagittal view, the AQUABEAM nozzle is identified and position registered with software. The treatment contours were then adjusted to conform to the intended resection margins. The median lobe, bladder neck and verumontanum were marked and confirmed in the treatment contour. The Aquablation Treatment was then started following the resection contour confirmed under ultrasound guidance.  TOTAL AQUABLATION RESECTION TIME: 2:33, 2:14   Once Aquablation resection was complete the 24 French aqua beam handpiece was carefully removed.  The continuous-flow sheath with the visual obturator was passed and then the loop and handle.  The trigone and the ureteral orifices were identified.  Resection of some of the residual median lobe and bladder neck tissue was done.  The bladder neck was identified at 6:00 and this was taken up to 12:00 with fulguration of the bladder neck and prostate for hemostasis.  Slight amount of anterior tissue was resected.  Similarly from 6:00 up to 12:00 on the left side of the bladder neck was identified by resecting some of the ablated tissue to identify the bladder neck and cauterize any bleeding.  Some anterior tissue on the left was resected.  This created excellent hemostasis.  All the chips were evacuated.  Ureteral orifices again identified and  noted to be normal without injury.  The scope was backed out and a 20 [place] hematuria catheter  was placed with 30 cc in the balloon.  The balloon was seated at the bladder neck and it was irrigated on light traction and noted to be clear to pink.  He was hooked up to CBI.  He was cleaned up and placed supine.  Catheter was placed on traction.  He was awakened and taken to the cover room in stable condition.  Complications: None  Blood loss: 50 mL  Specimens: None  Drains: 20 French three-way hematuria catheter with 30 cc in the balloon  Disposition: Patient stable to PACU

## 2023-11-02 NOTE — H&P (Signed)
 H&P  Chief Complaint: BPH with lower urinary tract symptoms  History of Present Illness: George Bailey is a 58 year old male with a history of BPH and lower urinary tract symptoms.  His prostates measured anywhere from 50 to 70 g range and he has obstruction on cystoscopy.  He has been on max medical therapy with double tamsulosin and finasteride.  He continues to have a weak stream frequency and nocturia and desires surgical treatment.  He presented today for robotic water jet ablation of the prostate.  He has been well without dysuria or gross hematuria.  No cough cold or congestion.  No fever.  Past Medical History:  Diagnosis Date   GERD (gastroesophageal reflux disease)    History of kidney stones    PONV (postoperative nausea and vomiting)    Tourette's disorder    Past Surgical History:  Procedure Laterality Date   BACK SURGERY     L5-S1 fusion   cataract surgery      CHOLECYSTECTOMY     COLON SURGERY  2014   colon resection due to diverticulitis   DORSAL COMPARTMENT RELEASE Left 11/12/2015   Procedure: LEFT WRIST DEQUERVAIN'S RELEASE;  Surgeon: Rober Chimera, MD;  Location: [facility];  Service: Orthopedics;  Laterality: Left;   ELBOW SURGERY Bilateral    Exercise Treadmill Stress Test  08/04/2016   LOW RISK.  Normal blood pressure response to exercise. No EKG changes. Walk 5 minutes - fair effort. 7 METS. No chest pain.  Achieved 92% of max protected heart rate.   FUNCTIONAL ENDOSCOPIC SINUS SURGERY     left knee surgery      SHOULDER SURGERY Left    TONSILLECTOMY     UVULOPALATOPHARYNGOPLASTY      Home Medications:  Medications Prior to Admission  Medication Sig Dispense Refill Last Dose/Taking   aspirin-acetaminophen -caffeine (EXCEDRIN MIGRAINE) 250-250-65 MG tablet Take 2 tablets by mouth in the morning.   10/26/2023   finasteride (PROSCAR) 5 MG tablet Take 5 mg by mouth in the morning.   11/02/2023 at  5:30 AM   naproxen sodium (ALEVE) 220 MG tablet Take 440 mg  by mouth every evening.   Past Week   olmesartan (BENICAR) 20 MG tablet Take 20 mg by mouth daily.   11/02/2023 at  5:30 AM   omeprazole (PRILOSEC OTC) 20 MG tablet Take 20 mg by mouth daily before breakfast.   11/02/2023 at  5:30 AM   tamsulosin (FLOMAX) 0.4 MG CAPS capsule Take 0.4 mg by mouth in the morning and at bedtime.   11/02/2023 at  5:30 AM   Allergies:  Allergies  Allergen Reactions   Wellbutrin [Bupropion] Other (See Comments)    dizziness   Adhesive [Tape] Other (See Comments)    If used on face caused rash unsure if it was the tape. But ok on other body parts   Erythromycin Nausea Only   Morphine  Nausea Only    With large doses    Family History  Problem Relation Age of Onset   Heart attack Mother    Diabetes Father    Heart disease Father    Lung disease Maternal Grandmother    Lung disease Maternal Grandfather    Stroke Maternal Grandfather    Colon cancer Paternal Grandmother    Cancer Paternal Grandfather    Social History:  reports that he has never smoked. He has never used smokeless tobacco. He reports that he does not drink alcohol and does not use drugs.  ROS: A complete review  of systems was performed.  All systems are negative except for pertinent findings as noted. Review of Systems  All other systems reviewed and are negative.    Physical Exam:  Vital signs in last 24 hours: Temp:  [98 F (36.7 C)] 98 F (36.7 C) (05/06 0830) Pulse Rate:  [85] 85 (05/06 0830) Resp:  [17] 17 (05/06 0830) BP: (165-175)/(108-116) 165/108 (05/06 0839) SpO2:  [99 %] 99 % (05/06 0830) Weight:  [99.8 kg] 99.8 kg (05/06 0843) General:  Alert and oriented, No acute distress HEENT: Normocephalic, atraumatic Cardiovascular: Regular rate and rhythm Lungs: Regular rate and effort Abdomen: Soft, nontender, nondistended, no abdominal masses Back: No CVA tenderness Extremities: No edema Neurologic: Grossly intact  Laboratory Data:  No results found for this or any  previous visit (from the past 24 hours). No results found for this or any previous visit (from the past 240 hours). Creatinine: No results for input(s): "CREATININE" in the last 168 hours.  Impression/Assessment:  BPH with lower urinary tract symptoms-  Plan:  I discussed with the patient the nature, potential benefits, risks and alternatives to robotic water jet ablation of the prostate, including side effects of the proposed treatment, the likelihood of the patient achieving the goals of the procedure, and any potential problems that might occur during the procedure or recuperation.  Again we discussed expected success rate for flow versus irritative symptoms and that frequency urgency and nocturia can persist.  We also discussed preserving ejaculation so that he could see the ejaculate.  He declined.  He would like to maximize voiding.  All questions answered. Patient elects to proceed.    Christina Coyer 11/02/2023, 9:45 AM

## 2023-11-02 NOTE — Discharge Instructions (Signed)
Robotic water jet ablation of the Prostate, Care After The following information offers guidance on how to care for yourself after your procedure. Your health care provider may also give you more specific instructions. If you have problems or questions, contact your health care provider. What can I expect after the procedure? After the procedure, it is common to have: Mild pain in your lower abdomen. Soreness or mild discomfort in your penis or when you urinate. This is from having the catheter inserted during the procedure. A sudden urge to urinate (urgency). A need to urinate often. A small amount of blood in your urine. You may notice some small blood clots in your urine. These are normal. Follow these instructions at home: Medicines Take over-the-counter and prescription medicines only as told by your health care provider. If you were prescribed an antibiotic medicine, take it as told by your health care provider. Do not stop taking the antibiotic even if you start to feel better. Activity  Rest as told by your health care provider. Avoid sitting for a long time without moving. Get up to take short walks every 1-2 hours. This is important to improve blood flow and breathing. Ask for help if you feel weak or unsteady. You may increase your physical activity gradually as you start to feel better. Do not drive or operate machinery until your health care provider says that it is safe. Do not ride in a car for long periods of time, or as told by your health care provider. Avoid intense physical activity for as long as told by your health care provider. Do not lift anything that is heavier than 10 lb (4.5 kg), or the limit that you are told, until your health care provider says that it is safe. Do not have sex until your health care provider approves. Return to your normal activities as told by your health care provider. Ask your health care provider what activities are safe for you. Preventing  constipation  You may need to take these actions to prevent or treat constipation: Drink enough fluid to keep your urine pale yellow. Take over-the-counter or prescription medicines. Eat foods that are high in fiber, such as beans, whole grains, and fresh fruits and vegetables. Limit foods that are high in fat and processed sugars, such as fried or sweet foods.   General instructions Do not strain when you have a bowel movement. Straining may lead to bleeding from the prostate. This may cause blood clots and trouble urinating. Do not use any products that contain nicotine or tobacco. These products include cigarettes, chewing tobacco, and vaping devices, such as e-cigarettes. If you need help quitting, ask your health care provider. If you go home with a tube draining your urine (urinary catheter), care for the catheter as told by your health care provider. Wear compression stockings as told by your health care provider. These stockings help to prevent blood clots and reduce swelling in your legs. Keep all follow-up visits. This is important. Contact a health care provider if: You have signs of infection, such as: Fever or chills. Urine that smells very bad. Swelling around your urethra that is getting worse. Swelling in your penis or testicles. You have difficulty urinating. You have pain that gets worse or does not improve with medicine. You have blood in your urine that does not go away after 1 week of resting and drinking more fluids. You have trouble having a bowel movement. You have trouble having or keeping an erection. No  semen comes out during orgasm (dry ejaculation). You have a urinary catheter in place, and you have: Spasms or pain. Problems with your catheter or your catheter is blocked. Get help right away if: You are unable to urinate. You are having more blood clots in your urine instead of fewer. You have: Large blood clots. A lot of blood in your urine. Pain in  your back or lower abdomen. You have difficulty breathing or shortness of breath. You develop swelling or pain in your leg. These symptoms may be an emergency. Get help right away. Call 911. Do not wait to see if the symptoms will go away. Do not drive yourself to the hospital. Summary After the procedure, it is common to have a small amount of blood in your urine. Follow restrictions about lifting and sexual activity as told by your health care provider. Ask what activities are safe for you. Keep all follow-up visits. This is important. This information is not intended to replace advice given to you by your health care provider. Make sure you discuss any questions you have with your health care provider. Document Revised: 03/11/2021 Document Reviewed: 03/11/2021 [employer] Patient Education  2024 [employer].

## 2023-11-05 DIAGNOSIS — N401 Enlarged prostate with lower urinary tract symptoms: Secondary | ICD-10-CM | POA: Diagnosis not present

## 2023-11-05 DIAGNOSIS — R3912 Poor urinary stream: Secondary | ICD-10-CM | POA: Diagnosis not present

## 2023-11-16 DIAGNOSIS — I10 Essential (primary) hypertension: Secondary | ICD-10-CM | POA: Diagnosis not present

## 2023-11-16 DIAGNOSIS — R519 Headache, unspecified: Secondary | ICD-10-CM | POA: Diagnosis not present

## 2023-11-16 DIAGNOSIS — F430 Acute stress reaction: Secondary | ICD-10-CM | POA: Diagnosis not present

## 2023-12-03 DIAGNOSIS — R8271 Bacteriuria: Secondary | ICD-10-CM | POA: Diagnosis not present

## 2023-12-03 DIAGNOSIS — R3912 Poor urinary stream: Secondary | ICD-10-CM | POA: Diagnosis not present

## 2023-12-03 DIAGNOSIS — N401 Enlarged prostate with lower urinary tract symptoms: Secondary | ICD-10-CM | POA: Diagnosis not present

## 2024-03-29 ENCOUNTER — Other Ambulatory Visit: Payer: Self-pay | Admitting: Internal Medicine

## 2024-03-29 DIAGNOSIS — R1030 Lower abdominal pain, unspecified: Secondary | ICD-10-CM

## 2024-03-29 DIAGNOSIS — R110 Nausea: Secondary | ICD-10-CM | POA: Diagnosis not present

## 2024-03-29 DIAGNOSIS — I10 Essential (primary) hypertension: Secondary | ICD-10-CM | POA: Diagnosis not present

## 2024-03-30 ENCOUNTER — Inpatient Hospital Stay: Admission: RE | Admit: 2024-03-30 | Source: Ambulatory Visit
